# Patient Record
Sex: Female | Born: 1960 | Race: White | Hispanic: No | Marital: Married | Smoking: Former smoker
Health system: Southern US, Community
[De-identification: ages and names within clinical notes are randomized; demographics above are authoritative.]

## PROBLEM LIST (undated history)

## (undated) DIAGNOSIS — N939 Abnormal uterine and vaginal bleeding, unspecified: Secondary | ICD-10-CM

## (undated) DIAGNOSIS — D259 Leiomyoma of uterus, unspecified: Secondary | ICD-10-CM

## (undated) DIAGNOSIS — Z8639 Personal history of other endocrine, nutritional and metabolic disease: Secondary | ICD-10-CM

## (undated) DIAGNOSIS — D649 Anemia, unspecified: Secondary | ICD-10-CM

## (undated) DIAGNOSIS — Z780 Asymptomatic menopausal state: Secondary | ICD-10-CM

## (undated) HISTORY — DX: Asymptomatic menopausal state: Z78.0

## (undated) HISTORY — DX: Abnormal uterine and vaginal bleeding, unspecified: N93.9

## (undated) HISTORY — DX: Anemia, unspecified: D64.9

## (undated) HISTORY — PX: TONSILLECTOMY: SHX5217

## (undated) HISTORY — DX: Leiomyoma of uterus, unspecified: D25.9

## (undated) HISTORY — DX: Personal history of other endocrine, nutritional and metabolic disease: Z86.39

---

## 2006-11-01 ENCOUNTER — Ambulatory Visit: Payer: Self-pay | Admitting: Unknown Physician Specialty

## 2008-12-09 ENCOUNTER — Ambulatory Visit: Payer: Self-pay | Admitting: Unknown Physician Specialty

## 2009-12-06 ENCOUNTER — Ambulatory Visit: Payer: Self-pay | Admitting: Internal Medicine

## 2009-12-09 ENCOUNTER — Ambulatory Visit: Payer: Self-pay | Admitting: Internal Medicine

## 2009-12-14 ENCOUNTER — Ambulatory Visit: Payer: Self-pay | Admitting: Unknown Physician Specialty

## 2010-01-06 ENCOUNTER — Ambulatory Visit: Payer: Self-pay | Admitting: Internal Medicine

## 2010-02-01 ENCOUNTER — Ambulatory Visit: Payer: Self-pay | Admitting: Gastroenterology

## 2010-02-05 ENCOUNTER — Ambulatory Visit: Payer: Self-pay | Admitting: Internal Medicine

## 2010-02-07 LAB — PATHOLOGY REPORT

## 2012-12-06 ENCOUNTER — Ambulatory Visit: Payer: Self-pay | Admitting: Adult Health

## 2012-12-17 ENCOUNTER — Ambulatory Visit (INDEPENDENT_AMBULATORY_CARE_PROVIDER_SITE_OTHER): Payer: No Typology Code available for payment source | Admitting: Adult Health

## 2012-12-17 ENCOUNTER — Encounter: Payer: Self-pay | Admitting: Adult Health

## 2012-12-17 VITALS — BP 108/60 | HR 75 | Temp 98.2°F | Resp 12 | Ht 67.0 in | Wt 164.0 lb

## 2012-12-17 DIAGNOSIS — G479 Sleep disorder, unspecified: Secondary | ICD-10-CM | POA: Insufficient documentation

## 2012-12-17 DIAGNOSIS — K5900 Constipation, unspecified: Secondary | ICD-10-CM

## 2012-12-17 DIAGNOSIS — Z0000 Encounter for general adult medical examination without abnormal findings: Secondary | ICD-10-CM | POA: Insufficient documentation

## 2012-12-17 DIAGNOSIS — N951 Menopausal and female climacteric states: Secondary | ICD-10-CM | POA: Insufficient documentation

## 2012-12-17 NOTE — Assessment & Plan Note (Addendum)
Normal physical exam. Request medical records from previous PCP. Check labs: cbc w/diff, bmet, hepatic panel, tsh, vit d, FSH, LH, lipids. Mammogram scheduled for end of month. Colonoscopy next year. PAP 2011 (normal).

## 2012-12-17 NOTE — Progress Notes (Signed)
Subjective:    Patient ID: Caitlyn [name], female    DOB: 26-Feb-1961, 52 y.o.   MRN: [id number]  HPI  Patient is a pleasant 52 y/o female who presents to clinic to establish care. She has been experiencing symptoms associated with perimenopause such as irregular menstrual cycles, night sweats and some difficulty sleeping. Overall patient feels well and feels in good health.    Past Medical History  Diagnosis Date  . H/O vitamin D deficiency      Past Surgical History  Procedure Laterality Date  . Tonsillectomy  1960s     Family History  Problem Relation Age of Onset  . Hypertension Mother   . Diabetes Mother   . Heart disease Mother   . Hypertension Father   . Hemochromatosis Brother      History   Social History  . Marital Status: Married    Spouse Name: N/A    Number of Children: 2  . Years of Education: 14   Occupational History  . Deputy Curator of [employer]   Social History Main Topics  . Smoking status: Former Smoker -- 10 years    Quit date: 05/08/1992  . Smokeless tobacco: Never Used  . Alcohol Use: Yes     Comment: occasionally  . Drug Use: No  . Sexually Active: Not on file   Other Topics Concern  . Not on file   Social History Narrative   Elleana was born and reared in [place]. She graduated from [employer] (now [employer]) in 1982 with a Business degree. 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 She lives at home with her daughter. Salma has been separated for approximately 10 years. She enjoys spending time with friends. She enjoys sports - baseball, football.     Review of Systems  Constitutional: Negative.   HENT: Negative.   Eyes:       Wears contacts and reading glasses. Last eye exam 11/2011.  Respiratory: Negative.   Cardiovascular: Negative.   Gastrointestinal: Positive for constipation. Negative for nausea, vomiting, abdominal pain, blood in stool and abdominal distention.   Endocrine: Negative.   Genitourinary: Positive for urgency and menstrual problem. Negative for dysuria, frequency, hematuria and flank pain.       Perimenopausal  Musculoskeletal: Negative.   Skin:       Itching  Allergic/Immunologic: Negative for food allergies.       Seasonal allergies  Neurological: Negative for tremors, seizures, weakness, light-headedness and numbness.       Occasional dizziness  Hematological: Negative.   Psychiatric/Behavioral: Positive for sleep disturbance. Negative for suicidal ideas, hallucinations, behavioral problems, confusion, self-injury, decreased concentration and agitation. The patient is nervous/anxious.     BP 108/60  Pulse 75  Temp(Src) 98.2 F (36.8 C) (Oral)  Resp 12  Ht 5\' 7"  (1.702 m)  Wt 164 lb (74.39 kg)  BMI 25.68 kg/m2  SpO2 99%  LMP 12/12/2012    Objective:   Physical Exam  Constitutional: She is oriented to person, place, and time. She appears well-developed and well-nourished. No distress.  HENT:  Head: Normocephalic and atraumatic.  Right Ear: External ear normal.  Left Ear: External ear normal.  Mouth/Throat: Oropharynx is clear and moist.  Eyes: Conjunctivae and EOM are normal. Pupils are equal, round, and reactive to light.  Neck: Normal range of motion. Neck supple. No tracheal deviation present. No thyromegaly present.  Cardiovascular: Normal rate, regular rhythm, normal heart  sounds and intact distal pulses.  Exam reveals no gallop and no friction rub.   No murmur heard. Pulmonary/Chest: Effort normal and breath sounds normal. No respiratory distress. She has no wheezes. She has no rales.  Abdominal: Soft. Bowel sounds are normal. She exhibits no distension and no mass. There is no tenderness. There is no rebound and no guarding.  Musculoskeletal: Normal range of motion. She exhibits no edema and no tenderness.  Lymphadenopathy:    She has no cervical adenopathy.  Neurological: She is alert and oriented to person,  place, and time. She has normal reflexes. No cranial nerve deficit. Coordination normal.  Skin: Skin is warm and dry.  Psychiatric: She has a normal mood and affect. Her behavior is normal. Judgment and thought content normal.      Assessment & Plan:

## 2012-12-17 NOTE — Assessment & Plan Note (Signed)
Symptoms of perimenopause. She does not have any hot flashes but experiences night sweats, sleep disturbance, menstrual irregularity. Check FSH, LH. Also checking routine labs.

## 2012-12-17 NOTE — Assessment & Plan Note (Signed)
Hx of constipation. As long as she takes her iron supplements she reports she stays regular. Typically, the opposite occurs. Suggested she continue her iron supplements.

## 2012-12-17 NOTE — Patient Instructions (Addendum)
   Thank you for choosing [facility] for your health care needs.  Please have your labs drawn at your earliest convenience and have them sent to my office.  The results will be available through MyChart for your convenience. Please remember to activate this. The activation code is located at the end of this form.

## 2012-12-17 NOTE — Assessment & Plan Note (Signed)
Falls asleep without any problems. Has trouble staying asleep. Perimenopausal. Recommend trying melatonin OTC.

## 2012-12-31 ENCOUNTER — Ambulatory Visit: Payer: Self-pay | Admitting: General Practice

## 2013-03-13 ENCOUNTER — Other Ambulatory Visit: Payer: Self-pay

## 2013-03-25 ENCOUNTER — Encounter: Payer: No Typology Code available for payment source | Admitting: Adult Health

## 2013-04-07 ENCOUNTER — Encounter: Payer: No Typology Code available for payment source | Admitting: Adult Health

## 2013-04-08 ENCOUNTER — Ambulatory Visit (INDEPENDENT_AMBULATORY_CARE_PROVIDER_SITE_OTHER): Payer: No Typology Code available for payment source | Admitting: Adult Health

## 2013-04-08 ENCOUNTER — Encounter: Payer: Self-pay | Admitting: Adult Health

## 2013-04-08 ENCOUNTER — Other Ambulatory Visit (HOSPITAL_COMMUNITY)
Admission: RE | Admit: 2013-04-08 | Discharge: 2013-04-08 | Disposition: A | Discharge status: Home / Self Care | Payer: No Typology Code available for payment source | Source: Ambulatory Visit | Attending: Adult Health | Admitting: Adult Health

## 2013-04-08 VITALS — BP 122/82 | HR 84 | Temp 97.8°F | Resp 14 | Ht 67.0 in | Wt 166.0 lb

## 2013-04-08 DIAGNOSIS — Z124 Encounter for screening for malignant neoplasm of cervix: Secondary | ICD-10-CM | POA: Insufficient documentation

## 2013-04-08 DIAGNOSIS — Z1151 Encounter for screening for human papillomavirus (HPV): Secondary | ICD-10-CM | POA: Insufficient documentation

## 2013-04-08 DIAGNOSIS — Z01419 Encounter for gynecological examination (general) (routine) without abnormal findings: Secondary | ICD-10-CM | POA: Insufficient documentation

## 2013-04-08 NOTE — Assessment & Plan Note (Signed)
PAP w/HPV screen.

## 2013-04-08 NOTE — Assessment & Plan Note (Signed)
GYN exam including breast, pelvic/PAP. Normal exam. Mammogram done at [facility] in August - normal. Requesting report. Requesting labs from [facility].

## 2013-04-08 NOTE — Progress Notes (Signed)
   Subjective:    Patient ID: Caitlyn [name], female    DOB: 1961/05/03, 52 y.o.   MRN: [id number]  HPI  Patient is a pleasant 52 y/o female who presents to clinic for her GYN exam including breast and PAP.  Mammogram done in August. Reports normal exam. Will request records.   Current Outpatient Prescriptions on File Prior to Visit  Medication Sig Dispense Refill  . Cholecalciferol (VITAMIN D) 2000 UNITS tablet Take 2,000 Units by mouth daily.      . ferrous sulfate 325 (65 FE) MG tablet Take 325 mg by mouth 2 (two) times daily.       No current facility-administered medications on file prior to visit.     Review of Systems  Constitutional: Negative.   Respiratory: Negative.   Cardiovascular: Negative.   Gastrointestinal: Negative.   Genitourinary: Negative.   Neurological: Negative.        Objective:   Physical Exam  Constitutional: She appears well-developed and well-nourished. No distress.  Pulmonary/Chest: Right breast exhibits no inverted nipple, no mass, no nipple discharge, no skin change and no tenderness. Left breast exhibits no inverted nipple, no mass, no nipple discharge, no skin change and no tenderness. Breasts are symmetrical.  Abdominal: Hernia confirmed negative in the right inguinal area and confirmed negative in the left inguinal area.  Genitourinary: Vagina normal. No breast swelling, tenderness, discharge or bleeding. No labial fusion. There is no rash, tenderness, lesion or injury on the right labia. There is no rash, tenderness, lesion or injury on the left labia. No erythema, tenderness or bleeding around the vagina. No foreign body around the vagina. No signs of injury around the vagina. No vaginal discharge found.  Lymphadenopathy:       Right: No inguinal adenopathy present.       Left: No inguinal adenopathy present.          Assessment & Plan:

## 2013-04-08 NOTE — Progress Notes (Signed)
Pre visit review using our clinic review tool, if applicable. No additional management support is needed unless otherwise documented below in the visit note. 

## 2013-04-11 ENCOUNTER — Encounter: Payer: Self-pay | Admitting: Adult Health

## 2013-04-14 NOTE — Telephone Encounter (Signed)
Mailed unread message to pt  

## 2013-04-24 ENCOUNTER — Encounter: Payer: Self-pay | Admitting: Internal Medicine

## 2013-07-23 ENCOUNTER — Telehealth: Payer: Self-pay | Admitting: Emergency Medicine

## 2013-07-23 NOTE — Telephone Encounter (Signed)
PT calling in stating she has lvm x3. Pt is having irregular periods. Pt started on 2/20 and has not stopped bleeding. Please advise whether this is normal or she needs to be seen. Please advise.

## 2013-07-23 NOTE — Telephone Encounter (Signed)
Patient stated the bleeding light, bright red but has not stopped since 2/20. Patient is technically due for her full cycle on 07/25/13. Patient wants to wait to see if she fully stops by Monday. She has a busy work schedule tomorrow and Friday, actually out of town- so going for a u/s right now would not work. She will call us Monday 07/28/13 in the morning if it hasn't stopped.

## 2013-07-24 NOTE — Telephone Encounter (Signed)
Got it.

## 2013-07-24 NOTE — Telephone Encounter (Signed)
I will not be here Monday morning. If she calls still having problems then send her for the ultrasound. I will sign when I get in.

## 2014-05-27 ENCOUNTER — Encounter: Payer: No Typology Code available for payment source | Admitting: Nurse Practitioner

## 2014-06-11 ENCOUNTER — Encounter: Payer: Self-pay | Admitting: Nurse Practitioner

## 2014-06-11 ENCOUNTER — Ambulatory Visit (INDEPENDENT_AMBULATORY_CARE_PROVIDER_SITE_OTHER): Payer: No Typology Code available for payment source | Admitting: Nurse Practitioner

## 2014-06-11 VITALS — BP 122/80 | HR 81 | Temp 98.2°F | Resp 12 | Ht 67.0 in | Wt 157.0 lb

## 2014-06-11 DIAGNOSIS — Z0000 Encounter for general adult medical examination without abnormal findings: Secondary | ICD-10-CM

## 2014-06-11 DIAGNOSIS — N939 Abnormal uterine and vaginal bleeding, unspecified: Secondary | ICD-10-CM

## 2014-06-11 DIAGNOSIS — R1031 Right lower quadrant pain: Secondary | ICD-10-CM

## 2014-06-11 DIAGNOSIS — G8929 Other chronic pain: Secondary | ICD-10-CM

## 2014-06-11 NOTE — Progress Notes (Signed)
   Subjective:    Patient ID: Caitlyn [name], female    DOB: 1960-07-22, 54 y.o.   MRN: [id number]  HPI    Review of Systems     Objective:   Physical Exam        Assessment & Plan:

## 2014-06-11 NOTE — Patient Instructions (Addendum)
Return in 1 year.   Health Maintenance Adopting a healthy lifestyle and getting preventive care can go a long way to promote health and wellness. Talk with your health care provider about what schedule of regular examinations is right for you. This is a good chance for you to check in with your provider about disease prevention and staying healthy. In between checkups, there are plenty of things you can do on your own. Experts have done a lot of research about which lifestyle changes and preventive measures are most likely to keep you healthy. Ask your health care provider for more information. WEIGHT AND DIET  Eat a healthy diet  Be sure to include plenty of vegetables, fruits, low-fat dairy products, and lean protein.  Do not eat a lot of foods high in solid fats, added sugars, or salt.  Get regular exercise. This is one of the most important things you can do for your health.  Most adults should exercise for at least 150 minutes each week. The exercise should increase your heart rate and make you sweat (moderate-intensity exercise).  Most adults should also do strengthening exercises at least twice a week. This is in addition to the moderate-intensity exercise.  Maintain a healthy weight  Body mass index (BMI) is a measurement that can be used to identify possible weight problems. It estimates body fat based on height and weight. Your health care provider can help determine your BMI and help you achieve or maintain a healthy weight.  For females 25 years of age and older:   A BMI below 18.5 is considered underweight.  A BMI of 18.5 to 24.9 is normal.  A BMI of 25 to 29.9 is considered overweight.  A BMI of 30 and above is considered obese.  Watch levels of cholesterol and blood lipids  You should start having your blood tested for lipids and cholesterol at 54 years of age, then have this test every 5 years.  You may need to have your cholesterol levels checked more often  if:  Your lipid or cholesterol levels are high.  You are older than 54 years of age.  You are at high risk for heart disease.  CANCER SCREENING   Lung Cancer  Lung cancer screening is recommended for adults 84-69 years old who are at high risk for lung cancer because of a history of smoking.  A yearly low-dose CT scan of the lungs is recommended for people who:  Currently smoke.  Have quit within the past 15 years.  Have at least a 30-pack-year history of smoking. A pack year is smoking an average of one pack of cigarettes a day for 1 year.  Yearly screening should continue until it has been 15 years since you quit.  Yearly screening should stop if you develop a health problem that would prevent you from having lung cancer treatment.  Breast Cancer  Practice breast self-awareness. This means understanding how your breasts normally appear and feel.  It also means doing regular breast self-exams. Let your health care provider know about any changes, no matter how small.  If you are in your 20s or 30s, you should have a clinical breast exam (CBE) by a health care provider every 1-3 years as part of a regular health exam.  If you are 69 or older, have a CBE every year. Also consider having a breast X-ray (mammogram) every year.  If you have a family history of breast cancer, talk to your health care provider about  genetic screening.  If you are at high risk for breast cancer, talk to your health care provider about having an MRI and a mammogram every year.  Breast cancer gene (BRCA) assessment is recommended for women who have family members with BRCA-related cancers. BRCA-related cancers include:  Breast.  Ovarian.  Tubal.  Peritoneal cancers.  Results of the assessment will determine the need for genetic counseling and BRCA1 and BRCA2 testing. Cervical Cancer Routine pelvic examinations to screen for cervical cancer are no longer recommended for nonpregnant women  who are considered low risk for cancer of the pelvic organs (ovaries, uterus, and vagina) and who do not have symptoms. A pelvic examination may be necessary if you have symptoms including those associated with pelvic infections. Ask your health care provider if a screening pelvic exam is right for you.   The Pap test is the screening test for cervical cancer for women who are considered at risk.  If you had a hysterectomy for a problem that was not cancer or a condition that could lead to cancer, then you no longer need Pap tests.  If you are older than 65 years, and you have had normal Pap tests for the past 10 years, you no longer need to have Pap tests.  If you have had past treatment for cervical cancer or a condition that could lead to cancer, you need Pap tests and screening for cancer for at least 20 years after your treatment.  If you no longer get a Pap test, assess your risk factors if they change (such as having a new sexual partner). This can affect whether you should start being screened again.  Some women have medical problems that increase their chance of getting cervical cancer. If this is the case for you, your health care provider may recommend more frequent screening and Pap tests.  The human papillomavirus (HPV) test is another test that may be used for cervical cancer screening. The HPV test looks for the virus that can cause cell changes in the cervix. The cells collected during the Pap test can be tested for HPV.  The HPV test can be used to screen women 28 years of age and older. Getting tested for HPV can extend the interval between normal Pap tests from three to five years.  An HPV test also should be used to screen women of any age who have unclear Pap test results.  After 54 years of age, women should have HPV testing as often as Pap tests.  Colorectal Cancer  This type of cancer can be detected and often prevented.  Routine colorectal cancer screening usually  begins at 54 years of age and continues through 54 years of age.  Your health care provider may recommend screening at an earlier age if you have risk factors for colon cancer.  Your health care provider may also recommend using home test kits to check for hidden blood in the stool.  A small camera at the end of a tube can be used to examine your colon directly (sigmoidoscopy or colonoscopy). This is done to check for the earliest forms of colorectal cancer.  Routine screening usually begins at age 70.  Direct examination of the colon should be repeated every 5-10 years through 54 years of age. However, you may need to be screened more often if early forms of precancerous polyps or small growths are found. Skin Cancer  Check your skin from head to toe regularly.  Tell your health care provider about  any new moles or changes in moles, especially if there is a change in a mole's shape or color.  Also tell your health care provider if you have a mole that is larger than the size of a pencil eraser.  Always use sunscreen. Apply sunscreen liberally and repeatedly throughout the day.  Protect yourself by wearing long sleeves, pants, a wide-brimmed hat, and sunglasses whenever you are outside. HEART DISEASE, DIABETES, AND HIGH BLOOD PRESSURE   Have your blood pressure checked at least every 1-2 years. High blood pressure causes heart disease and increases the risk of stroke.  If you are between 10 years and 71 years old, ask your health care provider if you should take aspirin to prevent strokes.  Have regular diabetes screenings. This involves taking a blood sample to check your fasting blood sugar level.  If you are at a normal weight and have a low risk for diabetes, have this test once every three years after 54 years of age.  If you are overweight and have a high risk for diabetes, consider being tested at a younger age or more often. PREVENTING INFECTION  Hepatitis B  If you have a  higher risk for hepatitis B, you should be screened for this virus. You are considered at high risk for hepatitis B if:  You were born in a country where hepatitis B is common. Ask your health care provider which countries are considered high risk.  Your parents were born in a high-risk country, and you have not been immunized against hepatitis B (hepatitis B vaccine).  You have HIV or AIDS.  You use needles to inject street drugs.  You live with someone who has hepatitis B.  You have had sex with someone who has hepatitis B.  You get hemodialysis treatment.  You take certain medicines for conditions, including cancer, organ transplantation, and autoimmune conditions. Hepatitis C  Blood testing is recommended for:  Everyone born from [birth year] through 1965.  Anyone with known risk factors for hepatitis C. Sexually transmitted infections (STIs)  You should be screened for sexually transmitted infections (STIs) including gonorrhea and chlamydia if:  You are sexually active and are younger than 54 years of age.  You are older than 54 years of age and your health care provider tells you that you are at risk for this type of infection.  Your sexual activity has changed since you were last screened and you are at an increased risk for chlamydia or gonorrhea. Ask your health care provider if you are at risk.  If you do not have HIV, but are at risk, it may be recommended that you take a prescription medicine daily to prevent HIV infection. This is called pre-exposure prophylaxis (PrEP). You are considered at risk if:  You are sexually active and do not regularly use condoms or know the HIV status of your partner(s).  You take drugs by injection.  You are sexually active with a partner who has HIV. Talk with your health care provider about whether you are at high risk of being infected with HIV. If you choose to begin PrEP, you should first be tested for HIV. You should then be tested  every 3 months for as long as you are taking PrEP.  PREGNANCY   If you are premenopausal and you may become pregnant, ask your health care provider about preconception counseling.  If you may become pregnant, take 400 to 800 micrograms (mcg) of folic acid every day.  If you want  to prevent pregnancy, talk to your health care provider about birth control (contraception). OSTEOPOROSIS AND MENOPAUSE   Osteoporosis is a disease in which the bones lose minerals and strength with aging. This can result in serious bone fractures. Your risk for osteoporosis can be identified using a bone density scan.  If you are 65 years of age or older, or if you are at risk for osteoporosis and fractures, ask your health care provider if you should be screened.  Ask your health care provider whether you should take a calcium or vitamin D supplement to lower your risk for osteoporosis.  Menopause may have certain physical symptoms and risks.  Hormone replacement therapy may reduce some of these symptoms and risks. Talk to your health care provider about whether hormone replacement therapy is right for you.  HOME CARE INSTRUCTIONS   Schedule regular health, dental, and eye exams.  Stay current with your immunizations.   Do not use any tobacco products including cigarettes, chewing tobacco, or electronic cigarettes.  If you are pregnant, do not drink alcohol.  If you are breastfeeding, limit how much and how often you drink alcohol.  Limit alcohol intake to no more than 1 drink per day for nonpregnant women. One drink equals 12 ounces of beer, 5 ounces of wine, or 1 ounces of hard liquor.  Do not use street drugs.  Do not share needles.  Ask your health care provider for help if you need support or information about quitting drugs.  Tell your health care provider if you often feel depressed.  Tell your health care provider if you have ever been abused or do not feel safe at home. Document  Released: 11/07/2010 Document Revised: 09/08/2013 Document Reviewed: 03/26/2013 [facility] Patient Information 2015 [place], [place]. This information is not intended to replace advice given to you by your health care provider. Make sure you discuss any questions you have with your health care provider.

## 2014-06-11 NOTE — Progress Notes (Signed)
Subjective:    Patient ID: Caitlyn [name], female    DOB: Jun 02, 1960, 54 y.o.   MRN: [id number]  HPI  Caitlyn [name] is a 54 yo female here for her annual exam.   1) Health Maintenance-   Diet- No formal diet  Exercise- No formal diet  Immunizations- Up to date  Mammogram- 2014 normal   Pap- 2014 okay for 5 years  Bone Density- N/A  Colonoscopy- Within the past few years (unsure of date)  [name] Exam- Dec. 2015  Dental Exam- Up to date  2) Chronic Problems-  Reviewed with pt  3) Acute Problems-  Periods- 16 days this time. First 2 days are heavy the rest days are light the rest. Easier periods when younger. No [place] studies in past   Review of Systems  Constitutional: Negative for fever, chills, diaphoresis, fatigue and unexpected weight change.  HENT: Negative for tinnitus and trouble swallowing.   Eyes: Negative for visual disturbance.  Respiratory: Negative for chest tightness, shortness of breath and wheezing.   Cardiovascular: Negative for chest pain, palpitations and leg swelling.  Gastrointestinal: Positive for abdominal pain and constipation. Negative for nausea, vomiting, diarrhea and abdominal distention.       Lower abdominal pain on right- pt feels it is ovary related  Genitourinary: Positive for vaginal bleeding and menstrual problem. Negative for dysuria.  Musculoskeletal: Negative for back pain and neck pain.  Skin: Negative for rash.  Neurological: Negative for dizziness, weakness and headaches.  Psychiatric/Behavioral: Positive for sleep disturbance. Negative for suicidal ideas. The patient is not nervous/anxious.        Denies depression   Past Medical History  Diagnosis Date  . H/O vitamin D deficiency   . Anemia     History   Social History  . Marital Status: Married    Spouse Name: N/A    Number of Children: 2  . Years of Education: 14   Occupational History  . Deputy Scientist, research (physical sciences) of [employer] History Main Topics    . Smoking status: Former Smoker -- 10 years    Quit date: 05/08/1992  . Smokeless tobacco: Never Used  . Alcohol Use: Yes     Comment: occasionally  . Drug Use: No  . Sexual Activity: Not on file   Other Topics Concern  . Not on file   Social History Narrative   [name] was born and reared in [place]. She graduated from [employer] (now [employer]) in 1982 with a Business degree. 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 She lives at home with her daughter. [name] has been separated for approximately 10 years. She enjoys spending time with friends. She enjoys sports - baseball, football.    Past Surgical History  Procedure Laterality Date  . Tonsillectomy  1960s    Family History  Problem Relation Age of Onset  . Hypertension Mother   . Diabetes Mother   . Heart disease Mother   . Hypertension Father   . Hemochromatosis Brother     No Known Allergies  Current Outpatient Prescriptions on File Prior to Visit  Medication Sig Dispense Refill  . Cholecalciferol (VITAMIN D) 2000 UNITS tablet Take 2,000 Units by mouth daily.    . ferrous sulfate 325 (65 FE) MG tablet Take 325 mg by mouth 2 (two) times daily.     No current facility-administered medications on file prior to visit.  Objective:   Physical Exam  Constitutional: She is oriented to person, place, and time. She appears well-developed and well-nourished. No distress.  BP 122/80 mmHg  Pulse 81  Temp(Src) 98.2 F (36.8 C) (Oral)  Resp 12  Ht 5\' 7"  (1.702 m)  Wt 157 lb (71.215 kg)  BMI 24.58 kg/m2  SpO2 98%   HENT:  Head: Normocephalic and atraumatic.  Right Ear: External ear normal.  Left Ear: External ear normal.  Eyes: Right [name] exhibits no discharge. Left [name] exhibits no discharge. No scleral icterus.  Neck: Normal range of motion. Neck supple. No thyromegaly present.  Cardiovascular: Normal rate, regular rhythm, normal heart sounds and intact distal pulses.  Exam reveals  no gallop and no friction rub.   No murmur heard. Pulmonary/Chest: Effort normal and breath sounds normal. No respiratory distress. She has no wheezes. She has no rales. She exhibits no mass and no tenderness. Right breast exhibits no inverted nipple, no mass, no nipple discharge, no skin change and no tenderness. Left breast exhibits no inverted nipple, no mass, no nipple discharge, no skin change and no tenderness. Breasts are symmetrical.  Genitourinary:  Deferred PAP until 2017.  Lymphadenopathy:    She has no cervical adenopathy.  Neurological: She is alert and oriented to person, place, and time. No cranial nerve deficit. She exhibits normal muscle tone. Coordination normal.  Skin: Skin is warm and dry. No rash noted. She is not diaphoretic.  Psychiatric: She has a normal mood and affect. Her behavior is normal. Judgment and thought content normal.      Assessment & Plan:

## 2014-06-11 NOTE — Progress Notes (Signed)
Pre visit review using our clinic review tool, if applicable. No additional management support is needed unless otherwise documented below in the visit note. 

## 2014-06-12 ENCOUNTER — Ambulatory Visit: Payer: Self-pay | Admitting: Nurse Practitioner

## 2014-06-14 DIAGNOSIS — G8929 Other chronic pain: Secondary | ICD-10-CM | POA: Insufficient documentation

## 2014-06-14 DIAGNOSIS — N950 Postmenopausal bleeding: Secondary | ICD-10-CM | POA: Insufficient documentation

## 2014-06-14 DIAGNOSIS — R1031 Right lower quadrant pain: Secondary | ICD-10-CM

## 2014-06-14 NOTE — Assessment & Plan Note (Signed)
Discussed acute and chronic issues. Reviewed health maintenance measures, PFSHx, and immunizations. Obtain routine labs TSH, Lipid panel, CBC w/ diff, A1c, and CMET (written out on script for pt to take to place [facility] [employer] uses for free lab work). Also, included [place].

## 2014-06-14 NOTE — Assessment & Plan Note (Signed)
Obtain Pelvic and transvaginal non-OB ultrasounds. FU after results.

## 2014-06-17 LAB — HEPATIC FUNCTION PANEL
ALK PHOS: 53 U/L (ref 25–125)
ALT: 16 U/L (ref 7–35)
AST: 17 U/L (ref 13–35)
Bilirubin, Total: 0.2 mg/dL

## 2014-06-17 LAB — BASIC METABOLIC PANEL
Creatinine: 0.8 mg/dL (ref 0.5–1.1)
GLUCOSE: 91 mg/dL
Potassium: 4.5 mmol/L (ref 3.4–5.3)
SODIUM: 142 mmol/L (ref 137–147)

## 2014-06-17 LAB — TSH: TSH: 2.24 u[IU]/mL (ref 0.41–5.90)

## 2014-06-17 LAB — LIPID PANEL
CHOLESTEROL: 216 mg/dL — AB (ref 0–200)
HDL: 67 mg/dL (ref 35–70)
LDL/HDL RATIO: 3.2
Triglycerides: 100 mg/dL (ref 40–160)

## 2014-06-17 LAB — CBC AND DIFFERENTIAL
HEMATOCRIT: 41 % (ref 36–46)
HEMOGLOBIN: 13 g/dL (ref 12.0–16.0)
Platelets: 219 10*3/uL (ref 150–399)
WBC: 6.7 10*3/mL

## 2014-07-07 ENCOUNTER — Other Ambulatory Visit: Payer: Self-pay | Admitting: Nurse Practitioner

## 2014-07-07 DIAGNOSIS — D259 Leiomyoma of uterus, unspecified: Secondary | ICD-10-CM

## 2014-08-13 HISTORY — PX: ENDOMETRIAL BIOPSY: SHX622

## 2015-04-21 ENCOUNTER — Ambulatory Visit: Payer: Self-pay | Admitting: Obstetrics and Gynecology

## 2015-06-29 ENCOUNTER — Encounter: Payer: Self-pay | Admitting: Obstetrics and Gynecology

## 2015-06-29 ENCOUNTER — Ambulatory Visit (INDEPENDENT_AMBULATORY_CARE_PROVIDER_SITE_OTHER): Payer: Managed Care, Other (non HMO) | Admitting: Obstetrics and Gynecology

## 2015-06-29 VITALS — BP 147/69 | HR 73 | Ht 67.0 in | Wt 162.0 lb

## 2015-06-29 DIAGNOSIS — D259 Leiomyoma of uterus, unspecified: Secondary | ICD-10-CM | POA: Diagnosis not present

## 2015-06-29 DIAGNOSIS — N939 Abnormal uterine and vaginal bleeding, unspecified: Secondary | ICD-10-CM | POA: Diagnosis not present

## 2015-06-29 NOTE — Progress Notes (Signed)
Chief complaint: 1. Uterine fibroids 2. Abnormal uterine bleeding  The patient is a 55 year old white female para 2002, perimenopausal, with known fibroid uterus and history of irregular bleeding, who presents for evaluation of this problem. No anemia symptoms. No pelvic pain. No atypical vaginal discharge.  Endometrial biopsy was performed in April 2016 for abnormal uterine bleeding; pathology demonstrated proliferative endometrium without hyperplasia or carcinoma. Ultrasound at that time demonstrated a multi-fibroid uterus. Thyroid studies were normal. CBC did not show evidence of anemia. Pelvic exam was consistent with a 10-12 week size fibroid uterus.  Menstrual calendar review: 04/26/2014-14 day cycle  05/26/2014-36 day cycle 08/12/2014 -14 day cycle 10/10/2014- 5 day cycle 11/02/2014 -9 day cycle 11/26/2014 -6 day cycle 8/4/ 2016 9- day cycle 01/02/2015 -8 day cycle 01/28/2015 -21 day cycle No further menses until present  Past Medical History  Diagnosis Date  . H/O vitamin D deficiency   . Anemia   . Abnormal uterine bleeding (AUB)   . Uterine leiomyoma   . Menopause    Past Surgical History  Procedure Laterality Date  . Tonsillectomy  1960s  . Endometrial biopsy  08/13/2014    weakly proliferative endometrium- no hyperplasia or carcinoma   Review of systems: Per history of present illness  OBJECTIVE: BP 147/69 mmHg  Pulse 73  Ht 5\' 7"  (1.702 m)  Wt 162 lb (73.483 kg)  BMI 25.37 kg/m2  LMP 01/28/2015 Pleasant well-appearing white female in no acute distress. Abdomen: Soft, nontender, without organomegaly Pelvic exam:  Bimanual exam-retroverted globular/bulky 14 week size uterus, decreased mobility, extending to adnexa bilaterally  ASSESSMENT: 1. Multi-fibroid uterus, 14 week size 2. Abnormal uterine bleeding without recent evaluation with endometrial biopsy  PLAN: 1. Ultrasound to assess for interval change in size of fibroids and endometrial stripe  assessment 2. Return in 1 week after ultrasound for possible endometrial biopsy and further management planning  A total of 15 minutes were spent face-to-face with the patient during this encounter and over half of that time dealt with counseling and coordination of care.  Brayton Mars, MD  Note: This dictation was prepared with Dragon dictation along with smaller phrase technology. Any transcriptional errors that result from this process are unintentional.

## 2015-06-29 NOTE — Patient Instructions (Signed)
1. U/S is scheduled. 2. Return 1 week after U/S for further management planning

## 2015-07-06 ENCOUNTER — Ambulatory Visit (INDEPENDENT_AMBULATORY_CARE_PROVIDER_SITE_OTHER): Payer: Managed Care, Other (non HMO)

## 2015-07-06 DIAGNOSIS — N939 Abnormal uterine and vaginal bleeding, unspecified: Secondary | ICD-10-CM

## 2015-07-06 DIAGNOSIS — R1031 Right lower quadrant pain: Secondary | ICD-10-CM | POA: Diagnosis not present

## 2015-07-06 DIAGNOSIS — G8929 Other chronic pain: Secondary | ICD-10-CM

## 2015-07-13 ENCOUNTER — Encounter: Payer: Self-pay | Admitting: Obstetrics and Gynecology

## 2015-07-13 ENCOUNTER — Ambulatory Visit (INDEPENDENT_AMBULATORY_CARE_PROVIDER_SITE_OTHER): Payer: Managed Care, Other (non HMO) | Admitting: Obstetrics and Gynecology

## 2015-07-13 ENCOUNTER — Ambulatory Visit: Payer: Managed Care, Other (non HMO) | Admitting: Obstetrics and Gynecology

## 2015-07-13 VITALS — BP 148/76 | HR 66 | Ht 67.0 in | Wt 160.7 lb

## 2015-07-13 DIAGNOSIS — R9389 Abnormal findings on diagnostic imaging of other specified body structures: Secondary | ICD-10-CM

## 2015-07-13 DIAGNOSIS — N841 Polyp of cervix uteri: Secondary | ICD-10-CM | POA: Diagnosis not present

## 2015-07-13 DIAGNOSIS — R938 Abnormal findings on diagnostic imaging of other specified body structures: Secondary | ICD-10-CM

## 2015-07-13 DIAGNOSIS — D259 Leiomyoma of uterus, unspecified: Secondary | ICD-10-CM | POA: Diagnosis not present

## 2015-07-13 DIAGNOSIS — N939 Abnormal uterine and vaginal bleeding, unspecified: Secondary | ICD-10-CM

## 2015-07-13 NOTE — Patient Instructions (Addendum)
1. Endometrial biopsy and cervical biopsy were taken today 2. Results will be given  by phone when available 3. Return in 6 months for follow-up

## 2015-07-13 NOTE — Progress Notes (Signed)
Chief complaint: 1.  Follow-up on ultrasound. 2.  Abnormal uterine bleeding 3.  History of uterine fibroids  55 year old female, para 2002, perimenopausal, with known fibroid uterus and history of irregular bleeding.  Presents for follow-up on recent pelvic ultrasound and scheduled for endometrial biopsy.  Uterine size over the past year has gone from 12-14 weeks. Endometrial biopsy April 2016 was benign.  Current ultrasound demonstrates multi-fibroid uterus and endometrial stripe measuring 12.8 mm.  Biopsy is indicated.  Past medical history, past surgical history, problem list, medications, and allergies are reviewed.  OBJECTIVE: BP 148/76 mmHg  Pulse 66  Ht 5\' 7"  (1.702 m)  Wt 160 lb 11.2 oz (72.893 kg)  BMI 25.16 kg/m2  LMP 01/28/2015 Pelvic exam: Bimanual-midplane uterus, globular, with posterior component, approximately 14 weeks' size. Endocervical polyp - 7 mm is identified on speculum exam  PROCEDURE: Endometrial biopsy/Cervical biopsy Uterine sounding-9 cm 3 mm pipette biopsy-2 passes with bloody tissue, abundant Procedure well-tolerated. Blood loss-minimal  Description-patient was positioned in the dorsal lithotomy position.  A Graves speculum is placed.  Cervix appears parous and pipette, 3 mm was introduced through the endocervical canal in a sterile technique with production of abundant tissue; repeat pass was performed with obtaining additional tissue. Procedure: Cervical biopsy Endocervical polyp is identified and removed with Tischler forceps.  Monsel solution is applied for hemostasis  ASSESSMENT: 1.  AUB. 2.  Multi-fibroid uterus. 3.  Cervical polyp  PLAN: 1.  Endometrial biopsy as noted. 2.  Cervical biopsy as noted. 3.  Ultrasound findings were reviewed. 4.  Patient will be notified by phone of results. 5.  Patient is to maintain menstrual calendar monitor and follow-up in 6 months for reassessment.  A total of 15 minutes were spent face-to-face with  the patient during this encounter and over half of that time dealt with counseling and coordination of care.  Brayton Mars, MD  Note: This dictation was prepared with Dragon dictation along with smaller phrase technology. Any transcriptional errors that result from this process are unintentional.

## 2015-07-16 LAB — PATHOLOGY

## 2016-02-02 NOTE — Progress Notes (Signed)
ANNUAL PREVENTATIVE CARE GYN  ENCOUNTER NOTE  Subjective:       Caitlyn [name] is a 55 y.o. G63P2002 female here for a routine annual gynecologic exam.  Current complaints: 1.  Dad passed away  December 08, 2015- pancreatic ca 2. hot flashes and nite sweats- no meds needed  No postmenopausal bleeding Irritable bowel syndrome, stable No pelvic pain  Gynecologic History No LMP recorded. Contraception: none Last Pap: unsure- . Results were: normal Last mammogram: 2016??. Results were: normal  Obstetric History OB History  Gravida Para Term Preterm AB Living  2 2 2     2   SAB TAB Ectopic Multiple Live Births          2    # Outcome Date GA Lbr Len/2nd Weight Sex Delivery Anes PTL Lv  2 Term 1992   7 lb 1.8 oz (3.225 kg) F Vag-Spont   LIV  1 Term 1985   9 lb (4.082 kg) M Vag-Spont   LIV      Past Medical History:  Diagnosis Date  . Abnormal uterine bleeding (AUB)   . Anemia   . H/O vitamin D deficiency   . Menopause   . Uterine leiomyoma     Past Surgical History:  Procedure Laterality Date  . ENDOMETRIAL BIOPSY  08/13/2014   weakly proliferative endometrium- no hyperplasia or carcinoma  . TONSILLECTOMY  1960s    Current Outpatient Prescriptions on File Prior to Visit  Medication Sig Dispense Refill  . Cholecalciferol (VITAMIN D) 2000 UNITS tablet Take 2,000 Units by mouth daily.    . ferrous sulfate 325 (65 FE) MG tablet Take 325 mg by mouth 2 (two) times daily.     No current facility-administered medications on file prior to visit.     No Known Allergies  Social History   Social History  . Marital status: Married    Spouse name: N/A  . Number of children: 2  . Years of education: 14   Occupational History  . Deputy Engineer, [employer] of [place] History Main Topics  . Smoking status: Former Smoker    Years: 10.00    Quit date: 05/08/1992  . Smokeless tobacco: Never Used  . Alcohol use Yes     Comment: rare  .  Drug use:   . Sexual activity: Not Currently   Other Topics Concern  . Not on file   Social History Narrative   Caitlyn [name] was born and reared in [place]. She graduated from [employer]now [employer]) in 1982 with a Business degree. 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 She lives at home with her daughter. Annaliese has been separated for approximately 10 years. She enjoys spending time with friends. She enjoys sports - baseball, football.    Family History  Problem Relation Age of Onset  . Hypertension Mother   . Diabetes Mother   . Heart disease Mother   . Hypertension Father   . Diabetes Father   . Pancreatic cancer Father   . Hemochromatosis Brother   . Cancer Neg Hx     The following portions of the patient's history were reviewed and updated as appropriate: allergies, current medications, past family history, past medical history, past social history, past surgical history and problem list.  Review of Systems ROS Review of Systems - General ROS: negative for - chills, fatigue, fever, hot flashes, night sweats, weight gain or weight loss  Psychological ROS: negative for - anxiety, decreased libido, depression, mood swings, physical abuse or sexual abuse Ophthalmic ROS: negative for - blurry vision, eye pain or loss of vision ENT ROS: negative for - headaches, hearing change, visual changes or vocal changes Allergy and Immunology ROS: negative for - hives, itchy/watery eyes or seasonal allergies Hematological and Lymphatic ROS: negative for - bleeding problems, bruising, swollen lymph nodes or weight loss Endocrine ROS: negative for - galactorrhea, hair pattern changes, hot flashes, malaise/lethargy, mood swings, palpitations, polydipsia/polyuria, skin changes, temperature intolerance or unexpected weight changes Breast ROS: negative for - new or changing breast lumps or nipple discharge Respiratory ROS: negative for - cough or shortness of  breath Cardiovascular ROS: negative for - chest pain, irregular heartbeat, palpitations or shortness of breath Gastrointestinal ROS: no abdominal pain, change in bowel habits, or black or bloody stools Genito-Urinary ROS: no dysuria, trouble voiding, or hematuria Musculoskeletal ROS: negative for - joint pain or joint stiffness Neurological ROS: negative for - bowel and bladder control changes Dermatological ROS: negative for rash and skin lesion changes   Objective:   BP 120/74   Pulse 73   Ht 5\' 7"  (1.702 m)   Wt 160 lb (72.6 kg)   LMP 07/21/2015 (Approximate)   BMI 25.06 kg/m  CONSTITUTIONAL: Well-developed, well-nourished female in no acute distress.  PSYCHIATRIC: Normal mood and affect. Normal behavior. Normal judgment and thought content. [place]: Alert and oriented to person, place, and time. Normal muscle tone coordination. No cranial nerve deficit noted. HENT:  Normocephalic, atraumatic, External right and left ear normal. Oropharynx is clear and moist EYES: Conjunctivae and EOM are normal. Pupils are equal, round, and reactive to light. No scleral icterus.  NECK: Normal range of motion, supple, no masses.  Normal thyroid.  SKIN: Skin is warm and dry. No rash noted. Not diaphoretic. No erythema. No pallor. CARDIOVASCULAR: Normal heart rate noted, regular rhythm, no murmur. RESPIRATORY: Clear to auscultation bilaterally. Effort and breath sounds normal, no problems with respiration noted. BREASTS: Symmetric in size. No masses, skin changes, nipple drainage, or lymphadenopathy. ABDOMEN: Soft, normal bowel sounds, no distention noted.  No tenderness, rebound or guarding.  BLADDER: Normal PELVIC:  External Genitalia: Normal  BUS: Normal  Vagina: Normal  Cervix: Normal; no lesions  Uterus: Mid plane to retroverted, bulky, 14 weeks size, mobile, nontender  Adnexa: Normal; nonpalpable and nontender  RV: External Exam NormaI, No Rectal Masses and Normal Sphincter tone   MUSCULOSKELETAL: Normal range of motion. No tenderness.  No cyanosis, clubbing, or edema.  2+ distal pulses. LYMPHATIC: No Axillary, Supraclavicular, or Inguinal Adenopathy.    Assessment:   Annual gynecologic examination 55 y.o. Contraception: none Normal BMI Problem List Items Addressed This Visit    None    Visit Diagnoses    Well woman exam with routine gynecological exam    -  Primary   Uterine leiomyoma, unspecified location         Menopause, symptomatic, not desiring medication Plan:  Pap: Pap Co Test Mammogram: Ordered Stool Guaiac Testing:  Ordered Labs: ., Lipid 1, FBS, TSH, Hemoglobin A1C and Vit D Level"". Routine preventative health maintenance measures emphasized: Exercise/Diet/Weight control, Tobacco Warnings, Alcohol/Substance use risks and Stress Management Return to [place], CMA  Brayton Mars, MD  Note: This dictation was prepared with Dragon dictation along with smaller phrase technology. Any transcriptional errors that result from this process are unintentional.

## 2016-02-03 ENCOUNTER — Encounter: Payer: Self-pay | Admitting: Obstetrics and Gynecology

## 2016-02-03 ENCOUNTER — Ambulatory Visit (INDEPENDENT_AMBULATORY_CARE_PROVIDER_SITE_OTHER): Payer: Managed Care, Other (non HMO) | Admitting: Obstetrics and Gynecology

## 2016-02-03 VITALS — BP 120/74 | HR 73 | Ht 67.0 in | Wt 160.0 lb

## 2016-02-03 DIAGNOSIS — Z1211 Encounter for screening for malignant neoplasm of colon: Secondary | ICD-10-CM

## 2016-02-03 DIAGNOSIS — Z01419 Encounter for gynecological examination (general) (routine) without abnormal findings: Secondary | ICD-10-CM

## 2016-02-03 DIAGNOSIS — N951 Menopausal and female climacteric states: Secondary | ICD-10-CM

## 2016-02-03 DIAGNOSIS — Z780 Asymptomatic menopausal state: Secondary | ICD-10-CM

## 2016-02-03 DIAGNOSIS — Z1231 Encounter for screening mammogram for malignant neoplasm of breast: Secondary | ICD-10-CM

## 2016-02-03 DIAGNOSIS — D259 Leiomyoma of uterus, unspecified: Secondary | ICD-10-CM | POA: Diagnosis not present

## 2016-02-03 NOTE — Patient Instructions (Signed)
1. Pap smear is done 2. Mammogram is ordered 3. Stool guaiac cards are given 4. Screening labs are ordered 5. Recommend calcium with vitamin D supplementation daily 6. Continue with healthy eating and exercise 7. Return in 1 year for annual exam

## 2016-02-08 LAB — PAP IG AND HPV HIGH-RISK
HPV, HIGH-RISK: NEGATIVE
PAP SMEAR COMMENT: 0

## 2016-02-09 ENCOUNTER — Other Ambulatory Visit: Payer: Self-pay

## 2016-02-09 DIAGNOSIS — Z299 Encounter for prophylactic measures, unspecified: Secondary | ICD-10-CM

## 2016-02-09 NOTE — Progress Notes (Signed)
Patient came in to have blood drawn for testing per Dr. Thamas Jaegers authorization.  Patient wants results sent to [facility] when the results are finalized.

## 2016-02-10 LAB — CMP12+LP+TP+TSH+6AC+CBC/D/PLT
ALBUMIN: 4.4 g/dL (ref 3.5–5.5)
ALT: 15 IU/L (ref 0–32)
AST: 17 IU/L (ref 0–40)
Albumin/Globulin Ratio: 1.8 (ref 1.2–2.2)
Alkaline Phosphatase: 68 IU/L (ref 39–117)
BASOS ABS: 0 10*3/uL (ref 0.0–0.2)
BILIRUBIN TOTAL: 0.5 mg/dL (ref 0.0–1.2)
BUN/Creatinine Ratio: 8 — ABNORMAL LOW (ref 9–23)
BUN: 7 mg/dL (ref 6–24)
Basos: 1 %
CHLORIDE: 102 mmol/L (ref 96–106)
CHOLESTEROL TOTAL: 211 mg/dL — AB (ref 100–199)
Calcium: 9.7 mg/dL (ref 8.7–10.2)
Chol/HDL Ratio: 2.9 ratio units (ref 0.0–4.4)
Creatinine, Ser: 0.91 mg/dL (ref 0.57–1.00)
EOS (ABSOLUTE): 0.1 10*3/uL (ref 0.0–0.4)
Eos: 2 %
FREE THYROXINE INDEX: 2.1 (ref 1.2–4.9)
GFR calc non Af Amer: 71 mL/min/{1.73_m2} (ref >59)
GFR, EST AFRICAN AMERICAN: 82 mL/min/{1.73_m2} (ref >59)
GGT: 12 IU/L (ref 0–60)
Globulin, Total: 2.4 g/dL (ref 1.5–4.5)
Glucose: 98 mg/dL (ref 65–99)
HDL: 73 mg/dL (ref >39)
Hematocrit: 45.4 % (ref 34.0–46.6)
Hemoglobin: 15 g/dL (ref 11.1–15.9)
IMMATURE GRANS (ABS): 0 10*3/uL (ref 0.0–0.1)
IMMATURE GRANULOCYTES: 0 %
IRON: 121 ug/dL (ref 27–159)
LDH: 182 IU/L (ref 119–226)
LDL Calculated: 122 mg/dL — ABNORMAL HIGH (ref 0–99)
LYMPHS: 15 %
Lymphocytes Absolute: 1 10*3/uL (ref 0.7–3.1)
MCH: 28.1 pg (ref 26.6–33.0)
MCHC: 33 g/dL (ref 31.5–35.7)
MCV: 85 fL (ref 79–97)
MONOCYTES: 8 %
MONOS ABS: 0.6 10*3/uL (ref 0.1–0.9)
NEUTROS ABS: 4.8 10*3/uL (ref 1.4–7.0)
NEUTROS PCT: 74 %
POTASSIUM: 5.3 mmol/L — AB (ref 3.5–5.2)
Phosphorus: 3.1 mg/dL (ref 2.5–4.5)
Platelets: 225 10*3/uL (ref 150–379)
RBC: 5.34 x10E6/uL — ABNORMAL HIGH (ref 3.77–5.28)
RDW: 16.2 % — AB (ref 12.3–15.4)
Sodium: 142 mmol/L (ref 134–144)
T3 UPTAKE RATIO: 29 % (ref 24–39)
T4 TOTAL: 7.3 ug/dL (ref 4.5–12.0)
TOTAL PROTEIN: 6.8 g/dL (ref 6.0–8.5)
TSH: 2.54 u[IU]/mL (ref 0.450–4.500)
Triglycerides: 82 mg/dL (ref 0–149)
Uric Acid: 5.4 mg/dL (ref 2.5–7.1)
VLDL CHOLESTEROL CAL: 16 mg/dL (ref 5–40)
WBC: 6.5 10*3/uL (ref 3.4–10.8)

## 2016-02-10 LAB — VITAMIN D 25 HYDROXY (VIT D DEFICIENCY, FRACTURES): Vit D, 25-Hydroxy: 28.6 ng/mL — ABNORMAL LOW (ref 30.0–100.0)

## 2016-02-10 LAB — HGB A1C W/O EAG: Hgb A1c MFr Bld: 5.6 % (ref 4.8–5.6)

## 2016-02-12 LAB — FECAL OCCULT BLOOD, IMMUNOCHEMICAL: Fecal Occult Bld: NEGATIVE

## 2016-04-06 ENCOUNTER — Other Ambulatory Visit: Payer: Self-pay | Admitting: Obstetrics and Gynecology

## 2016-04-06 ENCOUNTER — Ambulatory Visit
Admission: RE | Admit: 2016-04-06 | Discharge: 2016-04-06 | Disposition: A | Discharge status: Home / Self Care | Payer: Managed Care, Other (non HMO) | Source: Ambulatory Visit | Attending: Obstetrics and Gynecology | Admitting: Obstetrics and Gynecology

## 2016-04-06 DIAGNOSIS — Z1231 Encounter for screening mammogram for malignant neoplasm of breast: Secondary | ICD-10-CM

## 2016-04-17 ENCOUNTER — Encounter: Payer: Self-pay | Admitting: Physician Assistant

## 2016-04-17 ENCOUNTER — Ambulatory Visit: Payer: Self-pay | Admitting: Physician Assistant

## 2016-04-17 VITALS — BP 115/70 | HR 66 | Temp 97.8°F

## 2016-04-17 DIAGNOSIS — M778 Other enthesopathies, not elsewhere classified: Secondary | ICD-10-CM

## 2016-04-17 DIAGNOSIS — J069 Acute upper respiratory infection, unspecified: Secondary | ICD-10-CM

## 2016-04-17 MED ORDER — AZITHROMYCIN 250 MG PO TABS: ORAL_TABLET | ORAL | 0 refills | Status: DC

## 2016-04-17 MED ORDER — FLUTICASONE PROPIONATE 50 MCG/ACT NA SUSP: 2.0000 | Freq: Every day | NASAL | 6 refills | Status: DC

## 2016-04-17 MED ORDER — MELOXICAM 15 MG PO TABS: 15.0000 mg | ORAL_TABLET | Freq: Every day | ORAL | 5 refills | Status: DC

## 2016-04-17 NOTE — Progress Notes (Signed)
S: C/o runny nose and congestion for 5 days, no fever, chills, cp/sob, v/d; mucus is green and thick, cough is dry, c/o of facial and dental pain. Also having r elbow pain for about a week, states she had to blow leaves for a few hours and the pain started after that, no numbness or tingling, ibuprofen does help  Using otc meds:   O: PE: vitals wnl, nad, perrl eomi, normocephalic, tms dull, nasal mucosa red and swollen, throat injected, neck supple no lymph, lungs c t a, cv rrr, neuro intact, r elbow neg for bony tenderness, full rom, n/v intact  A:  Acute sinusitis, tendonitis   P: drink fluids, continue regular meds , use otc meds of choice, return if not improving in 5 days, return earlier if worsening , zpack, mobic, flonase

## 2016-05-03 ENCOUNTER — Ambulatory Visit: Payer: Self-pay

## 2016-05-30 ENCOUNTER — Ambulatory Visit: Payer: Self-pay | Admitting: Physician Assistant

## 2016-05-30 VITALS — BP 129/85 | HR 74 | Temp 98.2°F

## 2016-05-30 DIAGNOSIS — R0981 Nasal congestion: Secondary | ICD-10-CM

## 2016-05-30 MED ORDER — PREDNISONE 10 MG PO TABS: ORAL_TABLET | ORAL | 0 refills | Status: DC

## 2016-05-30 NOTE — Progress Notes (Signed)
S: c/o of sinus congestion and drainage for several weeks.  Right ear pain.  Coughs first thing in morning and at night.  Using flonase without any complete relief.  Using advil cold and sinus for congestion.  No fever.  O: TMS dull with fluid bilat, nose clear, throat min drainage, neck supple without aden.  Lungs clear bilat.  Viral URI with cough Eustachian tube dysfunction  P:  Prednisone dose pk 6 days

## 2016-06-08 ENCOUNTER — Ambulatory Visit: Payer: Self-pay | Admitting: Physician Assistant

## 2016-06-08 ENCOUNTER — Encounter: Payer: Self-pay | Admitting: Physician Assistant

## 2016-06-08 VITALS — BP 110/70 | HR 105 | Temp 98.5°F

## 2016-06-08 DIAGNOSIS — J069 Acute upper respiratory infection, unspecified: Secondary | ICD-10-CM

## 2016-06-08 DIAGNOSIS — J209 Acute bronchitis, unspecified: Secondary | ICD-10-CM

## 2016-06-08 LAB — POCT INFLUENZA A/B
INFLUENZA A, POC: NEGATIVE
Influenza B, POC: NEGATIVE

## 2016-06-08 MED ORDER — HYDROCOD POLST-CPM POLST ER 10-8 MG/5ML PO SUER: 5.0000 mL | Freq: Two times a day (BID) | ORAL | 0 refills | Status: DC | PRN

## 2016-06-08 MED ORDER — LEVOFLOXACIN 500 MG PO TABS: 500.0000 mg | ORAL_TABLET | Freq: Every day | ORAL | 0 refills | Status: DC

## 2016-06-08 NOTE — Progress Notes (Signed)
S: C/o continued cough and congestion, chest is sore from coughing, no fever, chills, but is achey and tired;  cough is dry and hacking; keeping pt awake at night;  denies cardiac type chest pain or sob, v/d, abd pain Remainder ros neg  O: vitals wnl, nad, tms clear, throat injected, neck supple no lymph, lungs c t a, cv rrr, neuro intact, flu swab neg  A:  Acute bronchitis   P:  rx medication:   levaquin 500mg  qd x 7d, tussionex 173ml nruse otc meds, tylenol or motrin as needed for fever/chills, return if not better in 3 -5 days, return earlier if worsening

## 2016-10-30 ENCOUNTER — Ambulatory Visit: Payer: Self-pay | Admitting: Physician Assistant

## 2016-10-30 ENCOUNTER — Encounter: Payer: Self-pay | Admitting: Physician Assistant

## 2016-10-30 VITALS — BP 110/75 | HR 74 | Temp 98.4°F | Resp 16

## 2016-10-30 DIAGNOSIS — R21 Rash and other nonspecific skin eruption: Secondary | ICD-10-CM

## 2016-10-30 DIAGNOSIS — L259 Unspecified contact dermatitis, unspecified cause: Secondary | ICD-10-CM

## 2016-10-30 MED ORDER — DEXAMETHASONE SODIUM PHOSPHATE 10 MG/ML IJ SOLN
10.0000 mg | Freq: Once | INTRAMUSCULAR | Status: AC
  Administered 2016-10-30: 10 mg via INTRAMUSCULAR

## 2016-10-30 MED ORDER — METHYLPREDNISOLONE 4 MG PO TBPK: ORAL_TABLET | ORAL | 0 refills | Status: DC

## 2016-10-30 NOTE — Progress Notes (Signed)
S: c/o rash on breasts, waist line, flank area, and some on chest, no new exposures, states she has been itching for awhile but the rash showed up about 2 days ago, no drainage or pustules, no fever/chills, can't think of any reason why she would break out  O: Vitals wnl, nad, skin with hive like pink raises areas on sides of both breasts, along left flank, in underwear area, no pustules or drainage, n/v intact  A: acute contact dermatitis, rash  P: decadron 10mg  IM, medrol dose pack, if not better after steroids or if rash returns will order allergen mini panel

## 2016-11-21 ENCOUNTER — Other Ambulatory Visit: Payer: Self-pay

## 2016-11-21 DIAGNOSIS — R21 Rash and other nonspecific skin eruption: Secondary | ICD-10-CM

## 2016-11-21 NOTE — Progress Notes (Signed)
Patient came in to have blood drawn for testing per Susan's authorization. 

## 2016-11-24 LAB — ALLERGEN PROFILE, BASIC FOOD
Allergen Corn, IgE: <0.1 kU/L
Beef IgE: <0.1 kU/L
Egg, Whole IgE: <0.1 kU/L
FOOD MIX (SEAFOODS) IGE: NEGATIVE
Milk IgE: <0.1 kU/L
Pork IgE: <0.1 kU/L
Wheat IgE: <0.1 kU/L

## 2016-11-24 LAB — ALLERGEN PROFILE, MINI-PANEL
D Farinae IgE: <0.1 kU/L
D Pteronyssinus IgE: <0.1 kU/L
Dog Dander IgE: <0.1 kU/L
Elm, American IgE: <0.1 kU/L
Mouse Urine IgE: <0.1 kU/L
Oak, White IgE: <0.1 kU/L

## 2016-11-24 NOTE — Progress Notes (Signed)
I spoke with the patient about her lab results and she expressed understanding.  Patient also expressed that she continues to itch all over.  I notified Manuela Schwartz and she authorized me to call in Atarax 25mg  1 tid # 30 with no refills in to [facility] on [address].

## 2017-02-01 NOTE — Progress Notes (Signed)
ANNUAL PREVENTATIVE CARE GYN  ENCOUNTER NOTE  Subjective:       Caitlyn [name] is a 56 y.o. G86P2002 female here for a routine annual gynecologic exam.  Current complaints: 1. lmp- 07/2015; had spotting last week  2. Wants iron checked h/o of anemia  Bowel and bladder function are normal. Spotting last week was just minimal isolated discharge. She denies pelvic pain.  Gynecologic History No LMP recorded. Patient is not currently having periods (Reason: Perimenopausal). Contraception: none Last Pap: 02/04/2016 neg/neg . Results were: normal Last mammogram: 04/06/2016 birad 1 Results were: normal  Obstetric History OB History  Gravida Para Term Preterm AB Living  2 2 2     2   SAB TAB Ectopic Multiple Live Births          2    # Outcome Date GA Lbr Len/2nd Weight Sex Delivery Anes PTL Lv  2 Term 1992   7 lb 1.8 oz (3.225 kg) F Vag-Spont   LIV  1 Term 1985   9 lb (4.082 kg) M Vag-Spont   LIV      Past Medical History:  Diagnosis Date  . Abnormal uterine bleeding (AUB)   . Anemia   . H/O vitamin D deficiency   . Menopause   . Uterine leiomyoma     Past Surgical History:  Procedure Laterality Date  . ENDOMETRIAL BIOPSY  08/13/2014   weakly proliferative endometrium- no hyperplasia or carcinoma  . TONSILLECTOMY  1960s    Current Outpatient Prescriptions on File Prior to Visit  Medication Sig Dispense Refill  . chlorpheniramine-HYDROcodone (TUSSIONEX PENNKINETIC ER) 10-8 MG/5ML SUER Take 5 mLs by mouth every 12 (twelve) hours as needed for cough. (Patient not taking: Reported on 10/30/2016) 150 mL 0  . Cholecalciferol (VITAMIN D) 2000 UNITS tablet Take 2,000 Units by mouth daily.    . ferrous sulfate 325 (65 FE) MG tablet Take 325 mg by mouth 2 (two) times daily.    . fluticasone (FLONASE) 50 MCG/ACT nasal spray Place 2 sprays into both nostrils daily. (Patient not taking: Reported on 10/30/2016) 16 g 6  . levofloxacin (LEVAQUIN) 500 MG tablet Take 1 tablet (500 mg total) by mouth  daily. (Patient not taking: Reported on 10/30/2016) 7 tablet 0  . meloxicam (MOBIC) 15 MG tablet Take 1 tablet (15 mg total) by mouth daily. (Patient not taking: Reported on 10/30/2016) 30 tablet 5  . methylPREDNISolone (MEDROL DOSEPAK) 4 MG TBPK tablet Take 6 pills on day one then decrease by 1 pill each day 21 tablet 0   No current facility-administered medications on file prior to visit.     No Known Allergies  Social History   Social History  . Marital status: Married    Spouse name: N/A  . Number of children: 2  . Years of education: 14   Occupational History  . Deputy Engineer, [employer] [place] History Main Topics  . Smoking status: Former Smoker    Years: 10.00    Quit date: 05/08/1992  . Smokeless tobacco: Never Used  . Alcohol use Yes     Comment: rare  . Drug use: Yes  . Sexual activity: Not Currently   Other Topics Concern  . Not on file   Social History Narrative   Caitlyn [name] was born and reared in [place]. She graduated from [employer] (now [employer]) in 1982 with a Business degree. 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 She lives at home with her daughter. Caitlyn [name] has been separated for approximately 10 years. She enjoys spending time with friends. She enjoys sports - baseball, football.    Family History  Problem Relation Age of Onset  . Hypertension Mother   . Diabetes Mother   . Heart disease Mother   . Hypertension Father   . Diabetes Father   . Pancreatic cancer Father   . Hemochromatosis Brother   . Cancer Neg Hx   . Breast cancer Neg Hx     The following portions of the patient's history were reviewed and updated as appropriate: allergies, current medications, past family history, past medical history, past social history, past surgical history and problem list.  Review of Systems Review of Systems  Constitutional:       History of vasomotor symptoms; tends to stay  warm  HENT: Negative.   Eyes: Negative.   Respiratory: Negative.   Cardiovascular: Negative.   Gastrointestinal: Negative.   Genitourinary: Negative.        Vaginal spotting last week  Musculoskeletal: Negative.   Skin: Negative.   Neurological: Negative.   Endo/Heme/Allergies: Negative.   Psychiatric/Behavioral: Negative.      Objective:   BP 127/85   Pulse 80   Ht 5\' 7"  (1.702 m)   Wt 162 lb 8 oz (73.7 kg)   LMP 02/01/2017 Comment: spotting only  BMI 25.45 kg/m  CONSTITUTIONAL: Well-developed, well-nourished female in no acute distress.  PSYCHIATRIC: Normal mood and affect. Normal behavior. Normal judgment and thought content. Caitlyn [name]: Alert and oriented to person, place, and time. Normal muscle tone coordination. No cranial nerve deficit noted. HENT:  Normocephalic, atraumatic, External right and left ear normal. Oropharynx is clear and moist EYES: Conjunctivae and EOM are normal.No scleral icterus.  NECK: Normal range of motion, supple, no masses.  Normal thyroid.  SKIN: Skin is warm and dry. No rash noted. Not diaphoretic. No erythema. No pallor. CARDIOVASCULAR: Normal heart rate noted, regular rhythm, no murmur. RESPIRATORY: Clear to auscultation bilaterally. Effort and breath sounds normal, no problems with respiration noted. BREASTS: Symmetric in size. No masses, skin changes, nipple drainage, or lymphadenopathy. ABDOMEN: Soft, normal bowel sounds, no distention noted.  No tenderness, rebound or guarding.  BLADDER: Normal PELVIC:  External Genitalia: Normal  BUS: Normal  Vagina: Normal  Cervix: Normal; no lesions; no cervical motion tenderness  Uterus: Mid plane to retroverted, bulky, 12 weeks size, mobile, nontender  Adnexa: Normal; nonpalpable and nontender  RV: External Exam NormaI, No Rectal Masses and Normal Sphincter tone  MUSCULOSKELETAL: Normal range of motion. No tenderness.  No cyanosis, clubbing, or edema.  2+ distal pulses. LYMPHATIC: No Axillary,  Supraclavicular, or Inguinal Adenopathy.    Assessment:   Annual gynecologic examination 56 y.o. Contraception: none Normal BMI Problem List Items Addressed This Visit    None    Visit Diagnoses    Well woman exam with routine gynecological exam    -  Primary   Encounter for screening mammogram for breast cancer       Screening for colon cancer       Uterine leiomyoma, unspecified location       Menopause        Postmenopausal bleeding Menopause, symptomatic, not desiring medication Plan:  Pap: Due 2020 Mammogram: Ordered Stool Guaiac Testing:  Ordered Labs: CBC, Lipid 1, FBS, TSH and Hemoglobin A1C Routine preventative health maintenance measures emphasized: Exercise/Diet/Weight control, Tobacco Warnings, Alcohol/Substance use risks and Stress Management Return to  Clinic - 1 Year Pelvic ultrasound Return for endometrial biopsy if endometrial stripe is greater than 4 mm   Joyice Faster, CMA  Brayton Mars, MD   Note: This dictation was prepared with Dragon dictation along with smaller phrase technology. Any transcriptional errors that result from this process are unintentional.

## 2017-02-06 ENCOUNTER — Ambulatory Visit (INDEPENDENT_AMBULATORY_CARE_PROVIDER_SITE_OTHER): Payer: Managed Care, Other (non HMO) | Admitting: Obstetrics and Gynecology

## 2017-02-06 ENCOUNTER — Encounter: Payer: Self-pay | Admitting: Obstetrics and Gynecology

## 2017-02-06 VITALS — BP 127/85 | HR 80 | Ht 67.0 in | Wt 162.5 lb

## 2017-02-06 DIAGNOSIS — D259 Leiomyoma of uterus, unspecified: Secondary | ICD-10-CM

## 2017-02-06 DIAGNOSIS — Z780 Asymptomatic menopausal state: Secondary | ICD-10-CM | POA: Insufficient documentation

## 2017-02-06 DIAGNOSIS — Z1211 Encounter for screening for malignant neoplasm of colon: Secondary | ICD-10-CM | POA: Diagnosis not present

## 2017-02-06 DIAGNOSIS — N950 Postmenopausal bleeding: Secondary | ICD-10-CM | POA: Diagnosis not present

## 2017-02-06 DIAGNOSIS — Z1231 Encounter for screening mammogram for malignant neoplasm of breast: Secondary | ICD-10-CM

## 2017-02-06 DIAGNOSIS — Z862 Personal history of diseases of the blood and blood-forming organs and certain disorders involving the immune mechanism: Secondary | ICD-10-CM | POA: Insufficient documentation

## 2017-02-06 DIAGNOSIS — Z01419 Encounter for gynecological examination (general) (routine) without abnormal findings: Secondary | ICD-10-CM | POA: Diagnosis not present

## 2017-02-06 NOTE — Patient Instructions (Addendum)
1. No Pap smear today. Next Pap smear is due in 2020. 2. Mammogram is ordered 3. Stool guaiac card testing is ordered for colon cancer screening 4. Screening labs are ordered 5. Continue with healthy eating and exercise 6. Recommend calcium with vitamin D supplementation-1200 mg/800 international units daily 7. Return in 1 year for annual exam 8. Ultrasound is scheduled to assess postmenopausal bleeding. If biopsy is needed, you will be contacted through my chart.   Health Maintenance for Postmenopausal Women Menopause is a normal process in which your reproductive ability comes to an end. This process happens gradually over a span of months to years, usually between the ages of 73 and 45. Menopause is complete when you have missed 12 consecutive menstrual periods. It is important to talk with your health care provider about some of the most common conditions that affect postmenopausal women, such as heart disease, cancer, and bone loss (osteoporosis). Adopting a healthy lifestyle and getting preventive care can help to promote your health and wellness. Those actions can also lower your chances of developing some of these common conditions. What should I know about menopause? During menopause, you may experience a number of symptoms, such as:  Moderate-to-severe hot flashes.  Night sweats.  Decrease in sex drive.  Mood swings.  Headaches.  Tiredness.  Irritability.  Memory problems.  Insomnia.  Choosing to treat or not to treat menopausal changes is an individual decision that you make with your health care provider. What should I know about hormone replacement therapy and supplements? Hormone therapy products are effective for treating symptoms that are associated with menopause, such as hot flashes and night sweats. Hormone replacement carries certain risks, especially as you become older. If you are thinking about using estrogen or estrogen with progestin treatments, discuss  the benefits and risks with your health care provider. What should I know about heart disease and stroke? Heart disease, heart attack, and stroke become more likely as you age. This may be due, in part, to the hormonal changes that your body experiences during menopause. These can affect how your body processes dietary fats, triglycerides, and cholesterol. Heart attack and stroke are both medical emergencies. There are many things that you can do to help prevent heart disease and stroke:  Have your blood pressure checked at least every 1-2 years. High blood pressure causes heart disease and increases the risk of stroke.  If you are 80-16 years old, ask your health care provider if you should take aspirin to prevent a heart attack or a stroke.  Do not use any tobacco products, including cigarettes, chewing tobacco, or electronic cigarettes. If you need help quitting, ask your health care provider.  It is important to eat a healthy diet and maintain a healthy weight. ? Be sure to include plenty of vegetables, fruits, low-fat dairy products, and lean protein. ? Avoid eating foods that are high in solid fats, added sugars, or salt (sodium).  Get regular exercise. This is one of the most important things that you can do for your health. ? Try to exercise for at least 150 minutes each week. The type of exercise that you do should increase your heart rate and make you sweat. This is known as moderate-intensity exercise. ? Try to do strengthening exercises at least twice each week. Do these in addition to the moderate-intensity exercise.  Know your numbers.Ask your health care provider to check your cholesterol and your blood glucose. Continue to have your blood tested as directed by  your health care provider.  What should I know about cancer screening? There are several types of cancer. Take the following steps to reduce your risk and to catch any cancer development as early as possible. Breast  Cancer  Practice breast self-awareness. ? This means understanding how your breasts normally appear and feel. ? It also means doing regular breast self-exams. Let your health care provider know about any changes, no matter how small.  If you are 40 or older, have a clinician do a breast exam (clinical breast exam or CBE) every year. Depending on your age, family history, and medical history, it may be recommended that you also have a yearly breast X-ray (mammogram).  If you have a family history of breast cancer, talk with your health care provider about genetic screening.  If you are at high risk for breast cancer, talk with your health care provider about having an MRI and a mammogram every year.  Breast cancer (BRCA) gene test is recommended for women who have family members with BRCA-related cancers. Results of the assessment will determine the need for genetic counseling and BRCA1 and for BRCA2 testing. BRCA-related cancers include these types: ? Breast. This occurs in males or females. ? Ovarian. ? Tubal. This may also be called fallopian tube cancer. ? Cancer of the abdominal or pelvic lining (peritoneal cancer). ? Prostate. ? Pancreatic.  Cervical, Uterine, and Ovarian Cancer Your health care provider may recommend that you be screened regularly for cancer of the pelvic organs. These include your ovaries, uterus, and vagina. This screening involves a pelvic exam, which includes checking for microscopic changes to the surface of your cervix (Pap test).  For women ages 21-65, health care providers may recommend a pelvic exam and a Pap test every three years. For women ages 19-65, they may recommend the Pap test and pelvic exam, combined with testing for human papilloma virus (HPV), every five years. Some types of HPV increase your risk of cervical cancer. Testing for HPV may also be done on women of any age who have unclear Pap test results.  Other health care providers may not  recommend any screening for nonpregnant women who are considered low risk for pelvic cancer and have no symptoms. Ask your health care provider if a screening pelvic exam is right for you.  If you have had past treatment for cervical cancer or a condition that could lead to cancer, you need Pap tests and screening for cancer for at least 20 years after your treatment. If Pap tests have been discontinued for you, your risk factors (such as having a new sexual partner) need to be reassessed to determine if you should start having screenings again. Some women have medical problems that increase the chance of getting cervical cancer. In these cases, your health care provider may recommend that you have screening and Pap tests more often.  If you have a family history of uterine cancer or ovarian cancer, talk with your health care provider about genetic screening.  If you have vaginal bleeding after reaching menopause, tell your health care provider.  There are currently no reliable tests available to screen for ovarian cancer.  Lung Cancer Lung cancer screening is recommended for adults 57-28 years old who are at high risk for lung cancer because of a history of smoking. A yearly low-dose CT scan of the lungs is recommended if you:  Currently smoke.  Have a history of at least 30 pack-years of smoking and you currently smoke or  have quit within the past 15 years. A pack-year is smoking an average of one pack of cigarettes per day for one year.  Yearly screening should:  Continue until it has been 15 years since you quit.  Stop if you develop a health problem that would prevent you from having lung cancer treatment.  Colorectal Cancer  This type of cancer can be detected and can often be prevented.  Routine colorectal cancer screening usually begins at age 73 and continues through age 72.  If you have risk factors for colon cancer, your health care provider may recommend that you be screened  at an earlier age.  If you have a family history of colorectal cancer, talk with your health care provider about genetic screening.  Your health care provider may also recommend using home test kits to check for hidden blood in your stool.  A small camera at the end of a tube can be used to examine your colon directly (sigmoidoscopy or colonoscopy). This is done to check for the earliest forms of colorectal cancer.  Direct examination of the colon should be repeated every 5-10 years until age 13. However, if early forms of precancerous polyps or small growths are found or if you have a family history or genetic risk for colorectal cancer, you may need to be screened more often.  Skin Cancer  Check your skin from head to toe regularly.  Monitor any moles. Be sure to tell your health care provider: ? About any new moles or changes in moles, especially if there is a change in a mole's shape or color. ? If you have a mole that is larger than the size of a pencil eraser.  If any of your family members has a history of skin cancer, especially at a young age, talk with your health care provider about genetic screening.  Always use sunscreen. Apply sunscreen liberally and repeatedly throughout the day.  Whenever you are outside, protect yourself by wearing long sleeves, pants, a wide-brimmed hat, and sunglasses.  What should I know about osteoporosis? Osteoporosis is a condition in which bone destruction happens more quickly than new bone creation. After menopause, you may be at an increased risk for osteoporosis. To help prevent osteoporosis or the bone fractures that can happen because of osteoporosis, the following is recommended:  If you are 79-39 years old, get at least 1,000 mg of calcium and at least 600 mg of vitamin D per day.  If you are older than age 85 but younger than age 69, get at least 1,200 mg of calcium and at least 600 mg of vitamin D per day.  If you are older than age 68,  get at least 1,200 mg of calcium and at least 800 mg of vitamin D per day.  Smoking and excessive alcohol intake increase the risk of osteoporosis. Eat foods that are rich in calcium and vitamin D, and do weight-bearing exercises several times each week as directed by your health care provider. What should I know about how menopause affects my mental health? Depression may occur at any age, but it is more common as you become older. Common symptoms of depression include:  Low or sad mood.  Changes in sleep patterns.  Changes in appetite or eating patterns.  Feeling an overall lack of motivation or enjoyment of activities that you previously enjoyed.  Frequent crying spells.  Talk with your health care provider if you think that you are experiencing depression. What should I know  about immunizations? It is important that you get and maintain your immunizations. These include:  Tetanus, diphtheria, and pertussis (Tdap) booster vaccine.  Influenza every year before the flu season begins.  Pneumonia vaccine.  Shingles vaccine.  Your health care provider may also recommend other immunizations. This information is not intended to replace advice given to you by your health care provider. Make sure you discuss any questions you have with your health care provider. Document Released: 06/16/2005 Document Revised: 11/12/2015 Document Reviewed: 01/26/2015 Elsevier Interactive Patient Education  2018 [employer].

## 2017-02-07 ENCOUNTER — Other Ambulatory Visit: Payer: Self-pay

## 2017-02-07 ENCOUNTER — Ambulatory Visit (INDEPENDENT_AMBULATORY_CARE_PROVIDER_SITE_OTHER): Payer: Managed Care, Other (non HMO)

## 2017-02-07 DIAGNOSIS — N950 Postmenopausal bleeding: Secondary | ICD-10-CM

## 2017-02-07 DIAGNOSIS — Z299 Encounter for prophylactic measures, unspecified: Secondary | ICD-10-CM

## 2017-02-07 NOTE — Progress Notes (Signed)
Patient came in to have blood drawn for testing per Dr. Thamas Jaegers orders.

## 2017-02-08 LAB — CMP12+LP+TP+TSH+6AC+CBC/D/PLT
ALT: 12 IU/L (ref 0–32)
AST: 18 IU/L (ref 0–40)
Albumin/Globulin Ratio: 1.8 (ref 1.2–2.2)
Albumin: 4.6 g/dL (ref 3.5–5.5)
Alkaline Phosphatase: 84 IU/L (ref 39–117)
BUN/Creatinine Ratio: 11 (ref 9–23)
BUN: 11 mg/dL (ref 6–24)
Basophils Absolute: 0.1 10*3/uL (ref 0.0–0.2)
Basos: 1 %
Bilirubin Total: 0.5 mg/dL (ref 0.0–1.2)
CALCIUM: 9.5 mg/dL (ref 8.7–10.2)
CHOL/HDL RATIO: 3.2 ratio (ref 0.0–4.4)
CREATININE: 1.02 mg/dL — AB (ref 0.57–1.00)
Chloride: 102 mmol/L (ref 96–106)
Cholesterol, Total: 220 mg/dL — ABNORMAL HIGH (ref 100–199)
EOS (ABSOLUTE): 0.1 10*3/uL (ref 0.0–0.4)
Eos: 1 %
Estimated CHD Risk: <0.5 times avg. (ref 0.0–1.0)
Free Thyroxine Index: 2.7 (ref 1.2–4.9)
GFR calc Af Amer: 71 mL/min/{1.73_m2} (ref >59)
GFR, EST NON AFRICAN AMERICAN: 62 mL/min/{1.73_m2} (ref >59)
GGT: 15 IU/L (ref 0–60)
GLOBULIN, TOTAL: 2.5 g/dL (ref 1.5–4.5)
Glucose: 81 mg/dL (ref 65–99)
HDL: 69 mg/dL (ref >39)
Hematocrit: 46.6 % (ref 34.0–46.6)
Hemoglobin: 15.4 g/dL (ref 11.1–15.9)
Immature Grans (Abs): 0 10*3/uL (ref 0.0–0.1)
Immature Granulocytes: 0 %
Iron: 107 ug/dL (ref 27–159)
LDH: 220 IU/L (ref 119–226)
LDL Calculated: 135 mg/dL — ABNORMAL HIGH (ref 0–99)
LYMPHS ABS: 1.1 10*3/uL (ref 0.7–3.1)
Lymphs: 16 %
MCH: 29.7 pg (ref 26.6–33.0)
MCHC: 33 g/dL (ref 31.5–35.7)
MCV: 90 fL (ref 79–97)
MONOS ABS: 0.6 10*3/uL (ref 0.1–0.9)
Monocytes: 9 %
NEUTROS ABS: 4.9 10*3/uL (ref 1.4–7.0)
Neutrophils: 73 %
PHOSPHORUS: 3 mg/dL (ref 2.5–4.5)
POTASSIUM: 4 mmol/L (ref 3.5–5.2)
Platelets: 211 10*3/uL (ref 150–379)
RBC: 5.19 x10E6/uL (ref 3.77–5.28)
RDW: 14.8 % (ref 12.3–15.4)
Sodium: 140 mmol/L (ref 134–144)
T3 Uptake Ratio: 31 % (ref 24–39)
T4 TOTAL: 8.8 ug/dL (ref 4.5–12.0)
TRIGLYCERIDES: 79 mg/dL (ref 0–149)
TSH: 2.48 u[IU]/mL (ref 0.450–4.500)
Total Protein: 7.1 g/dL (ref 6.0–8.5)
Uric Acid: 4.6 mg/dL (ref 2.5–7.1)
VLDL Cholesterol Cal: 16 mg/dL (ref 5–40)
WBC: 6.7 10*3/uL (ref 3.4–10.8)

## 2017-02-08 LAB — HGB A1C W/O EAG: HEMOGLOBIN A1C: 5.4 % (ref 4.8–5.6)

## 2017-02-13 LAB — FECAL OCCULT BLOOD, IMMUNOCHEMICAL: Fecal Occult Bld: NEGATIVE

## 2017-04-18 ENCOUNTER — Ambulatory Visit
Admission: RE | Admit: 2017-04-18 | Discharge: 2017-04-18 | Disposition: A | Discharge status: Home / Self Care | Payer: Managed Care, Other (non HMO) | Source: Ambulatory Visit | Attending: Obstetrics and Gynecology | Admitting: Obstetrics and Gynecology

## 2017-04-18 ENCOUNTER — Other Ambulatory Visit: Payer: Self-pay | Admitting: Obstetrics and Gynecology

## 2017-04-18 DIAGNOSIS — R928 Other abnormal and inconclusive findings on diagnostic imaging of breast: Secondary | ICD-10-CM | POA: Insufficient documentation

## 2017-04-18 DIAGNOSIS — Z1231 Encounter for screening mammogram for malignant neoplasm of breast: Secondary | ICD-10-CM

## 2017-04-20 ENCOUNTER — Other Ambulatory Visit: Payer: Self-pay | Admitting: Obstetrics and Gynecology

## 2017-04-20 DIAGNOSIS — R928 Other abnormal and inconclusive findings on diagnostic imaging of breast: Secondary | ICD-10-CM

## 2017-04-20 DIAGNOSIS — N6489 Other specified disorders of breast: Secondary | ICD-10-CM

## 2017-04-25 ENCOUNTER — Ambulatory Visit
Admission: RE | Admit: 2017-04-25 | Discharge: 2017-04-25 | Disposition: A | Discharge status: Home / Self Care | Payer: Managed Care, Other (non HMO) | Source: Ambulatory Visit | Attending: Obstetrics and Gynecology | Admitting: Obstetrics and Gynecology

## 2017-04-25 DIAGNOSIS — R928 Other abnormal and inconclusive findings on diagnostic imaging of breast: Secondary | ICD-10-CM

## 2017-04-25 DIAGNOSIS — N6489 Other specified disorders of breast: Secondary | ICD-10-CM | POA: Diagnosis not present

## 2017-04-26 ENCOUNTER — Other Ambulatory Visit: Payer: Self-pay

## 2017-04-26 ENCOUNTER — Ambulatory Visit: Payer: Managed Care, Other (non HMO)

## 2017-05-29 ENCOUNTER — Ambulatory Visit: Payer: Self-pay | Admitting: Emergency Medicine

## 2017-05-29 VITALS — BP 100/80 | HR 71 | Temp 99.3°F | Resp 16

## 2017-05-29 DIAGNOSIS — J0101 Acute recurrent maxillary sinusitis: Secondary | ICD-10-CM

## 2017-05-29 MED ORDER — AMOXICILLIN 875 MG PO TABS: 875.0000 mg | ORAL_TABLET | Freq: Two times a day (BID) | ORAL | 0 refills | Status: DC

## 2017-05-29 NOTE — Progress Notes (Signed)
Subjective.  Patient enters with a 2 day history of facial congestion ear pain and fever. She is unable to breathe due to nasal congestion and feels significant pressure in her sinuses. She has a history of sinus infections approximately 2 times a year. She has had a mild cough but it has not been significant. She has been taking Advil Cold and Sinus. Objective. HEENT exam. Pupils equal reactive to light. TMs are clear bilaterally. Nose is congestion and raw appearing. Throat appears normal without drainage. Neck is supple without adenopathy. Chest clear to auscultation. Heart regular rate and rhythm. Assessment. Patient has an upper Restoril infection with bilateral maxillary sinusitis. Plan. #1 continue Advil cold and sinus. #2 saline spray netti  pot. #3 amoxicillin 875 twice a day #20.

## 2017-05-29 NOTE — Patient Instructions (Signed)

## 2017-06-12 ENCOUNTER — Ambulatory Visit: Payer: Self-pay | Admitting: Family Medicine

## 2017-06-12 VITALS — BP 110/70 | HR 87 | Temp 98.5°F | Resp 16

## 2017-06-12 DIAGNOSIS — J069 Acute upper respiratory infection, unspecified: Secondary | ICD-10-CM

## 2017-06-12 DIAGNOSIS — H6983 Other specified disorders of Eustachian tube, bilateral: Secondary | ICD-10-CM

## 2017-06-12 MED ORDER — PREDNISONE 20 MG PO TABS: ORAL_TABLET | ORAL | 0 refills | Status: DC

## 2017-06-12 MED ORDER — FLUTICASONE PROPIONATE 50 MCG/ACT NA SUSP: 2.0000 | Freq: Every day | NASAL | 0 refills | Status: DC

## 2017-06-12 NOTE — Progress Notes (Signed)
Patient ID: Caitlyn [name], female    DOB: [birth year]  Age: 57 y.o. MRN: [id number]  Chief Complaint  Patient presents with  . Sinusitis    Subjective:   Treated for a sinusitis and headache a couple weeks ago.  Since then has persisted with rhinorrhea and congestion.  Now has pressure sensation in her ears and feels like she is down in a well.  She does not smoke.  She is not having a bad headache down.  Does cough some.  Takes Tylenol flu and sinus.  Current allergies, medications, problem list, past/family and social histories reviewed.  Objective:  BP 110/70   Pulse 87   Temp 98.5 F (36.9 C) (Oral)   Resp 16   SpO2 99%   No major acute distress.  TMs normal on the right, a little dull on the left.  There is proximal of fluid behind the drum.  Sinuses nontender.  Nose running.  Throat clear.  Neck supple without nodes.  Chest clear to auscultation.  Heart regular without murmur.  Assessment & Plan:   Assessment: 1. Dysfunction of both eustachian tubes   2. Acute upper respiratory infection       Plan: We will treat for eustachian tube dysfunction.  The prednisone will also help the sinuses are clear on upper hope.  No orders of the defined types were placed in this encounter.   Meds ordered this encounter  Medications  . predniSONE (DELTASONE) 20 MG tablet    Sig: Take 3 pills daily for 2 days, then 2 daily for 2 days, then 1 daily for 2 days for eustachian tube dysfunction    Dispense:  12 tablet    Refill:  0  . fluticasone (FLONASE) 50 MCG/ACT nasal spray    Sig: Place 2 sprays into both nostrils daily.    Dispense:  16 g    Refill:  0         Patient Instructions  Drink plenty of fluids and get enough rest  Use the fluticasone spray 2 sprays each nostril twice daily for 4 days, then decrease to once daily.  Take the prednisone 20 mg 3 pills daily for 2 days, then 2 daily for 2 days, then 1 daily for 2 days  Continue taking the Tylenol fluids sinus if  you need to for pain or congestion.  See your family physician or return if worse   Eustachian Tube Dysfunction The eustachian tube connects the middle ear to the back of the nose. It regulates air pressure in the middle ear by allowing air to move between the ear and nose. It also helps to drain fluid from the middle ear space. When the eustachian tube does not function properly, air pressure, fluid, or both can build up in the middle ear. Eustachian tube dysfunction can affect one or both ears. What are the causes? This condition happens when the eustachian tube becomes blocked or cannot open normally. This may result from:  Ear infections.  Colds and other upper respiratory infections.  Allergies.  Irritation, such as from cigarette smoke or acid from the stomach coming up into the esophagus (gastroesophageal reflux).  Sudden changes in air pressure, such as from descending in an airplane.  Abnormal growths in the nose or throat, such as nasal polyps, tumors, or enlarged tissue at the back of the throat (adenoids).  What increases the risk? This condition may be more likely to develop in people who smoke and people who are overweight. Eustachian  tube dysfunction may also be more likely to develop in children, especially children who have:  Certain birth defects of the mouth, such as cleft palate.  Large tonsils and adenoids.  What are the signs or symptoms? Symptoms of this condition may include:  A feeling of fullness in the ear.  Ear pain.  Clicking or popping noises in the ear.  Ringing in the ear.  Hearing loss.  Loss of balance.  Symptoms may get worse when the air pressure around you changes, such as when you travel to an area of high elevation or fly on an airplane. How is this diagnosed? This condition may be diagnosed based on:  Your symptoms.  A physical exam of your ear, nose, and throat.  Tests, such as those that measure: ? The movement of your  eardrum (tympanogram). ? Your hearing (audiometry).  How is this treated? Treatment depends on the cause and severity of your condition. If your symptoms are mild, you may be able to relieve your symptoms by moving air into ("popping") your ears. If you have symptoms of fluid in your ears, treatment may include:  Decongestants.  Antihistamines.  Nasal sprays or ear drops that contain medicines that reduce swelling (steroids).  In some cases, you may need to have a procedure to drain the fluid in your eardrum (myringotomy). In this procedure, a small tube is placed in the eardrum to:  Drain the fluid.  Restore the air in the middle ear space.  Follow these instructions at home:  Take over-the-counter and prescription medicines only as told by your health care provider.  Use techniques to help pop your ears as recommended by your health care provider. These may include: ? Chewing gum. ? Yawning. ? Frequent, forceful swallowing. ? Closing your mouth, holding your nose closed, and gently blowing as if you are trying to blow air out of your nose.  Do not do any of the following until your health care provider approves: ? Travel to high altitudes. ? Fly in airplanes. ? Work in a Pension scheme manager or room. ? Scuba dive.  Keep your ears dry. Dry your ears completely after showering or bathing.  Do not smoke.  Keep all follow-up visits as told by your health care provider. This is important. Contact a health care provider if:  Your symptoms do not go away after treatment.  Your symptoms come back after treatment.  You are unable to pop your ears.  You have: ? A fever. ? Pain in your ear. ? Pain in your head or neck. ? Fluid draining from your ear.  Your hearing suddenly changes.  You become very dizzy.  You lose your balance. This information is not intended to replace advice given to you by your health care provider. Make sure you discuss any questions you have with  your health care provider. Document Released: 05/21/2015 Document Revised: 09/30/2015 Document Reviewed: 05/13/2014 Elsevier Interactive Patient Education  [employer].     Return if symptoms worsen or fail to improve.   HOPPER,DAVID, MD 06/12/2017

## 2017-06-12 NOTE — Patient Instructions (Addendum)
Drink plenty of fluids and get enough rest  Use the fluticasone spray 2 sprays each nostril twice daily for 4 days, then decrease to once daily.  Take the prednisone 20 mg 3 pills daily for 2 days, then 2 daily for 2 days, then 1 daily for 2 days  Continue taking the Tylenol fluids sinus if you need to for pain or congestion.  See your family physician or return if worse   Eustachian Tube Dysfunction The eustachian tube connects the middle ear to the back of the nose. It regulates air pressure in the middle ear by allowing air to move between the ear and nose. It also helps to drain fluid from the middle ear space. When the eustachian tube does not function properly, air pressure, fluid, or both can build up in the middle ear. Eustachian tube dysfunction can affect one or both ears. What are the causes? This condition happens when the eustachian tube becomes blocked or cannot open normally. This may result from:  Ear infections.  Colds and other upper respiratory infections.  Allergies.  Irritation, such as from cigarette smoke or acid from the stomach coming up into the esophagus (gastroesophageal reflux).  Sudden changes in air pressure, such as from descending in an airplane.  Abnormal growths in the nose or throat, such as nasal polyps, tumors, or enlarged tissue at the back of the throat (adenoids).  What increases the risk? This condition may be more likely to develop in people who smoke and people who are overweight. Eustachian tube dysfunction may also be more likely to develop in children, especially children who have:  Certain birth defects of the mouth, such as cleft palate.  Large tonsils and adenoids.  What are the signs or symptoms? Symptoms of this condition may include:  A feeling of fullness in the ear.  Ear pain.  Clicking or popping noises in the ear.  Ringing in the ear.  Hearing loss.  Loss of balance.  Symptoms may get worse when the air  pressure around you changes, such as when you travel to an area of high elevation or fly on an airplane. How is this diagnosed? This condition may be diagnosed based on:  Your symptoms.  A physical exam of your ear, nose, and throat.  Tests, such as those that measure: ? The movement of your eardrum (tympanogram). ? Your hearing (audiometry).  How is this treated? Treatment depends on the cause and severity of your condition. If your symptoms are mild, you may be able to relieve your symptoms by moving air into ("popping") your ears. If you have symptoms of fluid in your ears, treatment may include:  Decongestants.  Antihistamines.  Nasal sprays or ear drops that contain medicines that reduce swelling (steroids).  In some cases, you may need to have a procedure to drain the fluid in your eardrum (myringotomy). In this procedure, a small tube is placed in the eardrum to:  Drain the fluid.  Restore the air in the middle ear space.  Follow these instructions at home:  Take over-the-counter and prescription medicines only as told by your health care provider.  Use techniques to help pop your ears as recommended by your health care provider. These may include: ? Chewing gum. ? Yawning. ? Frequent, forceful swallowing. ? Closing your mouth, holding your nose closed, and gently blowing as if you are trying to blow air out of your nose.  Do not do any of the following until your health care provider approves: ?  Travel to high altitudes. ? Fly in airplanes. ? Work in a Pension scheme manager or room. ? Scuba dive.  Keep your ears dry. Dry your ears completely after showering or bathing.  Do not smoke.  Keep all follow-up visits as told by your health care provider. This is important. Contact a health care provider if:  Your symptoms do not go away after treatment.  Your symptoms come back after treatment.  You are unable to pop your ears.  You have: ? A fever. ? Pain in  your ear. ? Pain in your head or neck. ? Fluid draining from your ear.  Your hearing suddenly changes.  You become very dizzy.  You lose your balance. This information is not intended to replace advice given to you by your health care provider. Make sure you discuss any questions you have with your health care provider. Document Released: 05/21/2015 Document Revised: 09/30/2015 Document Reviewed: 05/13/2014 Elsevier Interactive Patient Education  [employer].

## 2017-10-05 ENCOUNTER — Ambulatory Visit: Payer: Self-pay

## 2018-01-31 NOTE — Progress Notes (Signed)
ANNUAL PREVENTATIVE CARE GYN  ENCOUNTER NOTE  Subjective:       Caitlyn [name] is a 57 y.o. G64P2002 female here for a routine annual gynecologic exam.  Current complaints:  1. Fluid I ears?????;  Patient has had some sinus drainage and previous diagnosis of ear infection with fluid behind the ears treated with prednisone; no fever chills or sweats; no cough  Bowel and bladder function are normal. No major interval health issues are identified. No sexual complaints. Gynecologic History LMP- postmenopausal Contraception: none Last Pap: 02/04/2016 neg/neg . Results were: normal Last mammogram: 04/2017  birad 1 Results were: normal  Obstetric History OB History  Gravida Para Term Preterm AB Living  2 2 2     2   SAB TAB Ectopic Multiple Live Births          2    # Outcome Date GA Lbr Len/2nd Weight Sex Delivery Anes PTL Lv  2 Term 1992   7 lb 1.8 oz (3.225 kg) F Vag-Spont   LIV  1 Term 1985   9 lb (4.082 kg) M Vag-Spont   LIV    Past Medical History:  Diagnosis Date  . Abnormal uterine bleeding (AUB)   . Anemia   . H/O vitamin D deficiency   . Menopause   . Uterine leiomyoma     Past Surgical History:  Procedure Laterality Date  . ENDOMETRIAL BIOPSY  08/13/2014   weakly proliferative endometrium- no hyperplasia or carcinoma  . TONSILLECTOMY  1960s    Current Outpatient Medications on File Prior to Visit  Medication Sig Dispense Refill  . amoxicillin (AMOXIL) 875 MG tablet Take 1 tablet (875 mg total) by mouth 2 (two) times daily. (Patient not taking: Reported on 06/12/2017) 20 tablet 0  . fluticasone (FLONASE) 50 MCG/ACT nasal spray Place 2 sprays into both nostrils daily. 16 g 0  . predniSONE (DELTASONE) 20 MG tablet Take 3 pills daily for 2 days, then 2 daily for 2 days, then 1 daily for 2 days for eustachian tube dysfunction 12 tablet 0   No current facility-administered medications on file prior to visit.     No Known Allergies  Social History   Socioeconomic  History  . Marital status: Married    Spouse name: Not on file  . Number of children: 2  . Years of education: 106  . Highest education level: Not on file  Occupational History  . 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 Financial resource strain: Not on file  . Food insecurity:    Worry: Not on file    Inability: Not on file  . Transportation needs:    Medical: Not on file    Non-medical: Not on file  Tobacco Use  . Smoking status: Former Smoker    Years: 10.00    Last attempt to quit: 05/08/1992    Years since quitting: 25.7  . Smokeless tobacco: Never Used  Substance and Sexual Activity  . Alcohol use: Yes    Comment: rare  . Drug use: No  . Sexual activity: Not Currently  Lifestyle  . Physical activity:    Days per week: Not on file    Minutes per session: Not on file  . Stress: Not on file  Relationships  . Social connections:    Talks on phone: Not on file    Gets together: Not on file  Attends religious service: Not on file    Active member of club or organization: Not on file    Attends meetings of clubs or organizations: Not on file    Relationship status: Not on file  . Intimate partner violence:    Fear of current or ex partner: Not on file    Emotionally abused: Not on file    Physically abused: Not on file    Forced sexual activity: Not on file  Other Topics Concern  . Not on file  Social History Narrative   Shristi was born and reared in [place]. She graduated from [employer]now [employer]) in 1982 with a Business degree. 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 She lives at home with her daughter. [place] has been separated for approximately 10 years. She enjoys spending time with friends. She enjoys sports - baseball, football.    Family History  Problem Relation Age of Onset  . Hypertension Mother   . Diabetes Mother   . Heart  disease Mother   . Hypertension Father   . Diabetes Father   . Pancreatic cancer Father   . Hemochromatosis Brother   . Cancer Neg Hx   . Breast cancer Neg Hx     The following portions of the patient's history were reviewed and updated as appropriate: allergies, current medications, past family history, past medical history, past social history, past surgical history and problem list.  Review of Systems Review of Systems  Constitutional:       Vasomotor symptoms moderate, not desiring treatment  HENT: Positive for congestion and ear pain.   Eyes: Negative.   Respiratory: Negative.   Cardiovascular: Negative.   Gastrointestinal: Negative.   Genitourinary: Negative.   Musculoskeletal: Negative.   Skin: Negative.   Neurological: Negative.   Endo/Heme/Allergies: Negative.   Psychiatric/Behavioral: Negative.       Objective:   BP 137/82   Pulse 80   Ht 5\' 7"  (1.702 m)   Wt 155 lb 4.8 oz (70.4 kg)   LMP 02/01/2017 Comment: spotting only  BMI 24.32 kg/m  CONSTITUTIONAL: Well-developed, well-nourished female in no acute distress.  PSYCHIATRIC: Normal mood and affect. Normal behavior. Normal judgment and thought content. Armstrong: Alert and oriented to person, place, and time. Normal muscle tone coordination. No cranial nerve deficit noted. HENT:  Normocephalic, atraumatic, External right and left ear normal.  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  NECK: Normal range of motion, supple, no masses.  Normal thyroid.  SKIN: Skin is warm and dry. No rash noted. Not diaphoretic. No erythema. No pallor. CARDIOVASCULAR: Normal heart rate noted, regular rhythm, no murmur. RESPIRATORY: Clear to auscultation bilaterally. Effort and breath sounds normal, no problems with respiration noted. BREASTS: Symmetric in size. No masses, skin changes, nipple drainage, or  lymphadenopathy. ABDOMEN: Soft, normal bowel sounds, no distention noted.  No tenderness, rebound or guarding.  BLADDER: Normal PELVIC:  External Genitalia: Normal  BUS: Normal  Vagina: Moderate vaginal atrophy; no discharge  Cervix: Normal; no lesions; no cervical motion tenderness  Uterus: Mid plane to retroverted, bulky, 10 weeks size, mobile, nontender  Adnexa: Normal; nonpalpable and nontender  RV: External Exam NormaI, No Rectal Masses and Normal Sphincter tone  MUSCULOSKELETAL: Normal range of motion. No tenderness.  No cyanosis, clubbing, or edema.  2+ distal pulses. LYMPHATIC: No Axillary, Supraclavicular, or Inguinal Adenopathy.    Assessment:  Annual gynecologic examination 57 y.o. 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  I have reviewed, updated, and concur with documentation submitted by Joyice Faster, CMA Joyice Faster, CMA  Brayton Mars, MD  Note: This dictation was prepared with Dragon dictation along with smaller phrase technology. Any transcriptional errors that result from this process are unintentional.

## 2018-02-07 ENCOUNTER — Ambulatory Visit (INDEPENDENT_AMBULATORY_CARE_PROVIDER_SITE_OTHER): Payer: Managed Care, Other (non HMO) | Admitting: Obstetrics and Gynecology

## 2018-02-07 ENCOUNTER — Encounter: Payer: Self-pay | Admitting: Obstetrics and Gynecology

## 2018-02-07 VITALS — BP 137/82 | HR 80 | Ht 67.0 in | Wt 155.3 lb

## 2018-02-07 DIAGNOSIS — Z780 Asymptomatic menopausal state: Secondary | ICD-10-CM

## 2018-02-07 DIAGNOSIS — N952 Postmenopausal atrophic vaginitis: Secondary | ICD-10-CM | POA: Insufficient documentation

## 2018-02-07 DIAGNOSIS — Z1211 Encounter for screening for malignant neoplasm of colon: Secondary | ICD-10-CM | POA: Diagnosis not present

## 2018-02-07 DIAGNOSIS — Z01419 Encounter for gynecological examination (general) (routine) without abnormal findings: Secondary | ICD-10-CM

## 2018-02-07 DIAGNOSIS — Z1231 Encounter for screening mammogram for malignant neoplasm of breast: Secondary | ICD-10-CM

## 2018-02-07 DIAGNOSIS — Z862 Personal history of diseases of the blood and blood-forming organs and certain disorders involving the immune mechanism: Secondary | ICD-10-CM

## 2018-02-07 NOTE — Patient Instructions (Signed)
1.  Pap smear is not done.  Next Pap smear is due 2020. 2.  Mammogram is ordered. 3.  Stool guaiac card testing for colon cancer screening is ordered. 4.  Screening labs are obtained. 5.  Continue with healthy eating, exercise, and control weight loss. 6.  Recommend calcium 600 mg twice a day and vitamin D 400 international units twice a day. 7.  Return in 1 year for annual exam-with Dr. Amalia Hailey.   Health Maintenance for Postmenopausal Women Menopause is a normal process in which your reproductive ability comes to an end. This process happens gradually over a span of months to years, usually between the ages of 78 and 71. Menopause is complete when you have missed 12 consecutive menstrual periods. It is important to talk with your health care provider about some of the most common conditions that affect postmenopausal women, such as heart disease, cancer, and bone loss (osteoporosis). Adopting a healthy lifestyle and getting preventive care can help to promote your health and wellness. Those actions can also lower your chances of developing some of these common conditions. What should I know about menopause? During menopause, you may experience a number of symptoms, such as:  Moderate-to-severe hot flashes.  Night sweats.  Decrease in sex drive.  Mood swings.  Headaches.  Tiredness.  Irritability.  Memory problems.  Insomnia.  Choosing to treat or not to treat menopausal changes is an individual decision that you make with your health care provider. What should I know about hormone replacement therapy and supplements? Hormone therapy products are effective for treating symptoms that are associated with menopause, such as hot flashes and night sweats. Hormone replacement carries certain risks, especially as you become older. If you are thinking about using estrogen or estrogen with progestin treatments, discuss the benefits and risks with your health care provider. What should I know  about heart disease and stroke? Heart disease, heart attack, and stroke become more likely as you age. This may be due, in part, to the hormonal changes that your body experiences during menopause. These can affect how your body processes dietary fats, triglycerides, and cholesterol. Heart attack and stroke are both medical emergencies. There are many things that you can do to help prevent heart disease and stroke:  Have your blood pressure checked at least every 1-2 years. High blood pressure causes heart disease and increases the risk of stroke.  If you are 21-78 years old, ask your health care provider if you should take aspirin to prevent a heart attack or a stroke.  Do not use any tobacco products, including cigarettes, chewing tobacco, or electronic cigarettes. If you need help quitting, ask your health care provider.  It is important to eat a healthy diet and maintain a healthy weight. ? Be sure to include plenty of vegetables, fruits, low-fat dairy products, and lean protein. ? Avoid eating foods that are high in solid fats, added sugars, or salt (sodium).  Get regular exercise. This is one of the most important things that you can do for your health. ? Try to exercise for at least 150 minutes each week. The type of exercise that you do should increase your heart rate and make you sweat. This is known as moderate-intensity exercise. ? Try to do strengthening exercises at least twice each week. Do these in addition to the moderate-intensity exercise.  Know your numbers.Ask your health care provider to check your cholesterol and your blood glucose. Continue to have your blood tested as directed by  your health care provider.  What should I know about cancer screening? There are several types of cancer. Take the following steps to reduce your risk and to catch any cancer development as early as possible. Breast Cancer  Practice breast self-awareness. ? This means understanding how your  breasts normally appear and feel. ? It also means doing regular breast self-exams. Let your health care provider know about any changes, no matter how small.  If you are 5 or older, have a clinician do a breast exam (clinical breast exam or CBE) every year. Depending on your age, family history, and medical history, it may be recommended that you also have a yearly breast X-ray (mammogram).  If you have a family history of breast cancer, talk with your health care provider about genetic screening.  If you are at high risk for breast cancer, talk with your health care provider about having an MRI and a mammogram every year.  Breast cancer (BRCA) gene test is recommended for women who have family members with BRCA-related cancers. Results of the assessment will determine the need for genetic counseling and BRCA1 and for BRCA2 testing. BRCA-related cancers include these types: ? Breast. This occurs in males or females. ? Ovarian. ? Tubal. This may also be called fallopian tube cancer. ? Cancer of the abdominal or pelvic lining (peritoneal cancer). ? Prostate. ? Pancreatic.  Cervical, Uterine, and Ovarian Cancer Your health care provider may recommend that you be screened regularly for cancer of the pelvic organs. These include your ovaries, uterus, and vagina. This screening involves a pelvic exam, which includes checking for microscopic changes to the surface of your cervix (Pap test).  For women ages 21-65, health care providers may recommend a pelvic exam and a Pap test every three years. For women ages 48-65, they may recommend the Pap test and pelvic exam, combined with testing for human papilloma virus (HPV), every five years. Some types of HPV increase your risk of cervical cancer. Testing for HPV may also be done on women of any age who have unclear Pap test results.  Other health care providers may not recommend any screening for nonpregnant women who are considered low risk for pelvic  cancer and have no symptoms. Ask your health care provider if a screening pelvic exam is right for you.  If you have had past treatment for cervical cancer or a condition that could lead to cancer, you need Pap tests and screening for cancer for at least 20 years after your treatment. If Pap tests have been discontinued for you, your risk factors (such as having a new sexual partner) need to be reassessed to determine if you should start having screenings again. Some women have medical problems that increase the chance of getting cervical cancer. In these cases, your health care provider may recommend that you have screening and Pap tests more often.  If you have a family history of uterine cancer or ovarian cancer, talk with your health care provider about genetic screening.  If you have vaginal bleeding after reaching menopause, tell your health care provider.  There are currently no reliable tests available to screen for ovarian cancer.  Lung Cancer Lung cancer screening is recommended for adults 50-66 years old who are at high risk for lung cancer because of a history of smoking. A yearly low-dose CT scan of the lungs is recommended if you:  Currently smoke.  Have a history of at least 30 pack-years of smoking and you currently smoke or  have quit within the past 15 years. A pack-year is smoking an average of one pack of cigarettes per day for one year.  Yearly screening should:  Continue until it has been 15 years since you quit.  Stop if you develop a health problem that would prevent you from having lung cancer treatment.  Colorectal Cancer  This type of cancer can be detected and can often be prevented.  Routine colorectal cancer screening usually begins at age 98 and continues through age 68.  If you have risk factors for colon cancer, your health care provider may recommend that you be screened at an earlier age.  If you have a family history of colorectal cancer, talk with  your health care provider about genetic screening.  Your health care provider may also recommend using home test kits to check for hidden blood in your stool.  A small camera at the end of a tube can be used to examine your colon directly (sigmoidoscopy or colonoscopy). This is done to check for the earliest forms of colorectal cancer.  Direct examination of the colon should be repeated every 5-10 years until age 1. However, if early forms of precancerous polyps or small growths are found or if you have a family history or genetic risk for colorectal cancer, you may need to be screened more often.  Skin Cancer  Check your skin from head to toe regularly.  Monitor any moles. Be sure to tell your health care provider: ? About any new moles or changes in moles, especially if there is a change in a mole's shape or color. ? If you have a mole that is larger than the size of a pencil eraser.  If any of your family members has a history of skin cancer, especially at a young age, talk with your health care provider about genetic screening.  Always use sunscreen. Apply sunscreen liberally and repeatedly throughout the day.  Whenever you are outside, protect yourself by wearing long sleeves, pants, a wide-brimmed hat, and sunglasses.  What should I know about osteoporosis? Osteoporosis is a condition in which bone destruction happens more quickly than new bone creation. After menopause, you may be at an increased risk for osteoporosis. To help prevent osteoporosis or the bone fractures that can happen because of osteoporosis, the following is recommended:  If you are 74-70 years old, get at least 1,000 mg of calcium and at least 600 mg of vitamin D per day.  If you are older than age 77 but younger than age 28, get at least 1,200 mg of calcium and at least 600 mg of vitamin D per day.  If you are older than age 71, get at least 1,200 mg of calcium and at least 800 mg of vitamin D per  day.  Smoking and excessive alcohol intake increase the risk of osteoporosis. Eat foods that are rich in calcium and vitamin D, and do weight-bearing exercises several times each week as directed by your health care provider. What should I know about how menopause affects my mental health? Depression may occur at any age, but it is more common as you become older. Common symptoms of depression include:  Low or sad mood.  Changes in sleep patterns.  Changes in appetite or eating patterns.  Feeling an overall lack of motivation or enjoyment of activities that you previously enjoyed.  Frequent crying spells.  Talk with your health care provider if you think that you are experiencing depression. What should I know  about immunizations? It is important that you get and maintain your immunizations. These include:  Tetanus, diphtheria, and pertussis (Tdap) booster vaccine.  Influenza every year before the flu season begins.  Pneumonia vaccine.  Shingles vaccine.  Your health care provider may also recommend other immunizations. This information is not intended to replace advice given to you by your health care provider. Make sure you discuss any questions you have with your health care provider. Document Released: 06/16/2005 Document Revised: 11/12/2015 Document Reviewed: 01/26/2015 Elsevier Interactive Patient Education  2018 [employer].

## 2018-02-08 ENCOUNTER — Other Ambulatory Visit: Payer: Self-pay

## 2018-02-08 DIAGNOSIS — Z01419 Encounter for gynecological examination (general) (routine) without abnormal findings: Secondary | ICD-10-CM

## 2018-02-09 LAB — TSH: TSH: 1.73 u[IU]/mL (ref 0.450–4.500)

## 2018-02-09 LAB — GLUCOSE, RANDOM: Glucose: 83 mg/dL (ref 65–99)

## 2018-02-09 LAB — LIPID PANEL
CHOL/HDL RATIO: 3.8 ratio (ref 0.0–4.4)
Cholesterol, Total: 229 mg/dL — ABNORMAL HIGH (ref 100–199)
HDL: 60 mg/dL (ref >39)
LDL Calculated: 152 mg/dL — ABNORMAL HIGH (ref 0–99)
Triglycerides: 84 mg/dL (ref 0–149)
VLDL CHOLESTEROL CAL: 17 mg/dL (ref 5–40)

## 2018-02-09 LAB — HEMOGLOBIN A1C
Est. average glucose Bld gHb Est-mCnc: 108 mg/dL
Hgb A1c MFr Bld: 5.4 % (ref 4.8–5.6)

## 2018-02-09 LAB — EPI PROCOLON(R), SEPTIN 9

## 2018-02-12 ENCOUNTER — Other Ambulatory Visit: Payer: Self-pay

## 2018-02-12 DIAGNOSIS — Z1211 Encounter for screening for malignant neoplasm of colon: Secondary | ICD-10-CM

## 2018-02-19 ENCOUNTER — Other Ambulatory Visit: Payer: Managed Care, Other (non HMO)

## 2018-02-19 DIAGNOSIS — Z1211 Encounter for screening for malignant neoplasm of colon: Secondary | ICD-10-CM

## 2018-02-25 LAB — EPI PROCOLON(R), SEPTIN 9

## 2018-04-19 ENCOUNTER — Ambulatory Visit
Admission: RE | Admit: 2018-04-19 | Discharge: 2018-04-19 | Disposition: A | Discharge status: Home / Self Care | Payer: Managed Care, Other (non HMO) | Source: Ambulatory Visit | Attending: Obstetrics and Gynecology | Admitting: Obstetrics and Gynecology

## 2018-04-19 DIAGNOSIS — Z01419 Encounter for gynecological examination (general) (routine) without abnormal findings: Secondary | ICD-10-CM | POA: Insufficient documentation

## 2019-02-10 ENCOUNTER — Encounter: Payer: Managed Care, Other (non HMO) | Admitting: Obstetrics and Gynecology

## 2019-02-11 ENCOUNTER — Encounter: Payer: Self-pay | Admitting: Obstetrics and Gynecology

## 2019-02-11 ENCOUNTER — Other Ambulatory Visit (HOSPITAL_COMMUNITY)
Admission: RE | Admit: 2019-02-11 | Discharge: 2019-02-11 | Disposition: A | Discharge status: Home / Self Care | Payer: Managed Care, Other (non HMO) | Source: Ambulatory Visit | Attending: Obstetrics and Gynecology | Admitting: Obstetrics and Gynecology

## 2019-02-11 ENCOUNTER — Ambulatory Visit (INDEPENDENT_AMBULATORY_CARE_PROVIDER_SITE_OTHER): Payer: Managed Care, Other (non HMO) | Admitting: Obstetrics and Gynecology

## 2019-02-11 ENCOUNTER — Other Ambulatory Visit: Payer: Self-pay

## 2019-02-11 VITALS — BP 160/117 | HR 117 | Ht 67.0 in | Wt 152.3 lb

## 2019-02-11 DIAGNOSIS — Z124 Encounter for screening for malignant neoplasm of cervix: Secondary | ICD-10-CM

## 2019-02-11 DIAGNOSIS — Z1231 Encounter for screening mammogram for malignant neoplasm of breast: Secondary | ICD-10-CM

## 2019-02-11 DIAGNOSIS — I10 Essential (primary) hypertension: Secondary | ICD-10-CM | POA: Diagnosis not present

## 2019-02-11 DIAGNOSIS — Z01419 Encounter for gynecological examination (general) (routine) without abnormal findings: Secondary | ICD-10-CM | POA: Diagnosis not present

## 2019-02-11 MED ORDER — HYDROCHLOROTHIAZIDE 12.5 MG PO TABS
12.5000 mg | ORAL_TABLET | Freq: Two times a day (BID) | ORAL | 1 refills | Status: DC
Start: 2019-02-11 — End: 2019-04-09

## 2019-02-11 NOTE — Progress Notes (Signed)
Patient comes in for yearly exam. She is due for PAP, labs, and mammogram. She is having some lower back pain.

## 2019-02-11 NOTE — Addendum Note (Signed)
Addended by: Durwin Glaze on: 02/11/2019 08:53 AM   Modules accepted: Orders

## 2019-02-11 NOTE — Progress Notes (Signed)
HPI:      [name] is a 58 y.o. [id number] who LMP was Patient's last menstrual period was 02/01/2017.  Subjective:   She presents today for her annual examination.  She slipped and pulled a muscle in her back the other day but it is improving with ibuprofen and rest. She denies vaginal bleeding or menopausal issues. Reports a family history both mother and father having hypertension.    Hx: The following portions of the patient's history were reviewed and updated as appropriate:             She  has a past medical history of Abnormal uterine bleeding (AUB), Anemia, H/O vitamin D deficiency, Menopause, and Uterine leiomyoma. She does not have any pertinent problems on file. She  has a past surgical history that includes Tonsillectomy (1960s) and Endometrial biopsy (08/13/2014). Her family history includes Diabetes in her father and mother; Heart disease in her mother; Hemochromatosis in her brother; Hypertension in her father and mother; Pancreatic cancer in her father. She  reports that she quit smoking about 26 years ago. She quit after 10.00 years of use. She has never used smokeless tobacco. She reports current alcohol use. She reports that she does not use drugs. She has a current medication list which includes the following prescription(s): hydrochlorothiazide. She has No Known Allergies.       Review of Systems:  Review of Systems  Constitutional: Denied constitutional symptoms, night sweats, recent illness, fatigue, fever, insomnia and weight loss.  Eyes: Denied eye symptoms, eye pain, photophobia, vision change and visual disturbance.  Ears/Nose/Throat/Neck: Denied ear, nose, throat or neck symptoms, hearing loss, nasal discharge, sinus congestion and sore throat.  Cardiovascular: Denied cardiovascular symptoms, arrhythmia, chest pain/pressure, edema, exercise intolerance, orthopnea and palpitations.  Respiratory: Denied pulmonary symptoms, asthma, pleuritic pain, productive  sputum, cough, dyspnea and wheezing.  Gastrointestinal: Denied, gastro-esophageal reflux, melena, nausea and vomiting.  Genitourinary: Denied genitourinary symptoms including symptomatic vaginal discharge, pelvic relaxation issues, and urinary complaints.  Musculoskeletal: Denied musculoskeletal symptoms, stiffness, swelling, muscle weakness and myalgia.  Dermatologic: Denied dermatology symptoms, rash and scar.  Neurologic: Denied neurology symptoms, dizziness, headache, neck pain and syncope.  Psychiatric: Denied psychiatric symptoms, anxiety and depression.  Endocrine: Denied endocrine symptoms including hot flashes and night sweats.   Meds:   No current outpatient medications on file prior to visit.   No current facility-administered medications on file prior to visit.     Objective:     Vitals:   02/11/19 0806  BP: (!) 160/117  Pulse: (!) 117              Physical examination General NAD, Conversant  HEENT Atraumatic; Op clear with mmm.  Normo-cephalic. Pupils reactive. Anicteric sclerae  Thyroid/Neck Smooth without nodularity or enlargement. Normal ROM.  Neck Supple.  Skin No rashes, lesions or ulceration. Normal palpated skin turgor. No nodularity.  Breasts: No masses or discharge.  Symmetric.  No axillary adenopathy.  Lungs: Clear to auscultation.No rales or wheezes. Normal Respiratory effort, no retractions.  Heart: NSR.  No murmurs or rubs appreciated. No periferal edema  Abdomen: Soft.  Non-tender.  No masses.  No HSM. No hernia  Extremities: Moves all appropriately.  Normal ROM for age. No lymphadenopathy.  Neuro: Oriented to PPT.  Normal mood. Normal affect.     Pelvic:   Vulva: Normal appearance.  No lesions.  Vagina: No lesions or abnormalities noted.  Significant vaginal atrophy  Support: Normal pelvic support.  Urethra No masses  tenderness or scarring.  Meatus Normal size without lesions or prolapse.  Cervix: Normal appearance.  No lesions.  Significant  atrophy  Anus: Normal exam.  No lesions.  Perineum: Normal exam.  No lesions.        Bimanual   Uterus: Normal size.  Non-tender.  Mobile.  AV.  Adnexae: No masses.  Non-tender to palpation.  Cul-de-sac: Negative for abnormality.      Assessment:    [id number] Patient Active Problem List   Diagnosis Date Noted  . Vaginal atrophy 02/07/2018  . History of anemia 02/06/2017  . Menopause 02/06/2017  . Uterine leiomyoma 06/29/2015  . Postmenopausal bleeding 06/14/2014  . Abdominal pain, chronic, right lower quadrant 06/14/2014  . Encounter for routine gynecological examination 04/08/2013  . Screening for cervical cancer 04/08/2013  . Routine general medical examination at a health care facility 12/17/2012  . Sleep disturbance 12/17/2012  . Constipation 12/17/2012     1. Well woman exam with routine gynecological exam   2. Encounter for screening mammogram for breast cancer   3. Essential hypertension     Patient with significant hypertension present.  She also has a family history of hypertension so she is "not surprised".   Plan:            1.  Basic Screening Recommendations The basic screening recommendations for asymptomatic women were discussed with the patient during her visit.  The age-appropriate recommendations were discussed with her and the rational for the tests reviewed.  When I am informed by the patient that another primary care physician has previously obtained the age-appropriate tests and they are up-to-date, only outstanding tests are ordered and referrals given as necessary.  Abnormal results of tests will be discussed with her when all of her results are completed. Pap performed-mammogram ordered- labs drawn 2.  We will begin treatment for hypertension using hydrochlorothiazide.  Strongly advised patient to seek long-term care with internist, family practice or nurse practitioner etc.  Orders Orders Placed This Encounter  Procedures  . MM 3D SCREEN BREAST  BILATERAL  . Hemoglobin A1c  . Lipid panel  . TSH     Meds ordered this encounter  Medications  . hydrochlorothiazide (HYDRODIURIL) 12.5 MG tablet    Sig: Take 1 tablet (12.5 mg total) by mouth 2 (two) times daily.    Dispense:  60 tablet    Refill:  1        F/U  No follow-ups on file.  Finis Bud, M.D. 02/11/2019 8:43 AM

## 2019-02-12 LAB — TSH: TSH: 3.28 u[IU]/mL (ref 0.450–4.500)

## 2019-02-12 LAB — HEMOGLOBIN A1C
Est. average glucose Bld gHb Est-mCnc: 108 mg/dL
Hgb A1c MFr Bld: 5.4 % (ref 4.8–5.6)

## 2019-02-12 LAB — LIPID PANEL
Chol/HDL Ratio: 3.5 ratio (ref 0.0–4.4)
Cholesterol, Total: 227 mg/dL — ABNORMAL HIGH (ref 100–199)
HDL: 65 mg/dL (ref >39)
LDL Chol Calc (NIH): 145 mg/dL — ABNORMAL HIGH (ref 0–99)
Triglycerides: 95 mg/dL (ref 0–149)
VLDL Cholesterol Cal: 17 mg/dL (ref 5–40)

## 2019-02-19 LAB — CYTOLOGY - PAP
Comment: NEGATIVE
Diagnosis: NEGATIVE
High risk HPV: NEGATIVE

## 2019-04-09 ENCOUNTER — Other Ambulatory Visit: Payer: Self-pay | Admitting: Obstetrics and Gynecology

## 2019-04-09 DIAGNOSIS — I10 Essential (primary) hypertension: Secondary | ICD-10-CM

## 2019-04-22 ENCOUNTER — Ambulatory Visit
Admission: RE | Admit: 2019-04-22 | Discharge: 2019-04-22 | Disposition: A | Discharge status: Home / Self Care | Payer: Managed Care, Other (non HMO) | Source: Ambulatory Visit | Attending: Obstetrics and Gynecology | Admitting: Obstetrics and Gynecology

## 2019-04-22 DIAGNOSIS — Z01419 Encounter for gynecological examination (general) (routine) without abnormal findings: Secondary | ICD-10-CM

## 2019-04-22 DIAGNOSIS — Z1231 Encounter for screening mammogram for malignant neoplasm of breast: Secondary | ICD-10-CM | POA: Insufficient documentation

## 2019-06-15 ENCOUNTER — Other Ambulatory Visit: Payer: Self-pay | Admitting: Obstetrics and Gynecology

## 2019-06-15 DIAGNOSIS — I10 Essential (primary) hypertension: Secondary | ICD-10-CM

## 2020-02-12 ENCOUNTER — Encounter: Payer: Self-pay | Admitting: Obstetrics and Gynecology

## 2020-02-12 ENCOUNTER — Other Ambulatory Visit: Payer: Self-pay

## 2020-02-12 ENCOUNTER — Ambulatory Visit (INDEPENDENT_AMBULATORY_CARE_PROVIDER_SITE_OTHER): Payer: Managed Care, Other (non HMO) | Admitting: Obstetrics and Gynecology

## 2020-02-12 VITALS — BP 125/71 | HR 99 | Ht 67.0 in | Wt 164.5 lb

## 2020-02-12 DIAGNOSIS — Z01419 Encounter for gynecological examination (general) (routine) without abnormal findings: Secondary | ICD-10-CM

## 2020-02-12 DIAGNOSIS — Z1231 Encounter for screening mammogram for malignant neoplasm of breast: Secondary | ICD-10-CM | POA: Diagnosis not present

## 2020-02-12 NOTE — Progress Notes (Signed)
HPI:      [name] is a 59 y.o. [id number] who LMP was Patient's last menstrual period was 02/01/2017.  Subjective:   She presents today for her annual examination.  She has no complaints.  She feels well today and has been doing well in general.  She continues to take hydrochlorothiazide for blood pressure and has remained controlled.  She did note that her cholesterol was elevated last year and says that she has been "occasionally watching her diet". She has not yet received the Covid vaccine.      Hx: The following portions of the patient's history were reviewed and updated as appropriate:             She  has a past medical history of Abnormal uterine bleeding (AUB), Anemia, H/O vitamin D deficiency, Menopause, and Uterine leiomyoma. She does not have any pertinent problems on file. She  has a past surgical history that includes Tonsillectomy (1960s) and Endometrial biopsy (08/13/2014). Her family history includes Diabetes in her father and mother; Heart disease in her mother; Hemochromatosis in her brother; Hypertension in her father and mother; Pancreatic cancer in her father. She  reports that she quit smoking about 27 years ago. She quit after 10.00 years of use. She has never used smokeless tobacco. She reports current alcohol use. She reports that she does not use drugs. She has a current medication list which includes the following prescription(s): hydrochlorothiazide. She has No Known Allergies.       Review of Systems:  Review of Systems  Constitutional: Denied constitutional symptoms, night sweats, recent illness, fatigue, fever, insomnia and weight loss.  Eyes: Denied eye symptoms, eye pain, photophobia, vision change and visual disturbance.  Ears/Nose/Throat/Neck: Denied ear, nose, throat or neck symptoms, hearing loss, nasal discharge, sinus congestion and sore throat.  Cardiovascular: Denied cardiovascular symptoms, arrhythmia, chest pain/pressure, edema, exercise  intolerance, orthopnea and palpitations.  Respiratory: Denied pulmonary symptoms, asthma, pleuritic pain, productive sputum, cough, dyspnea and wheezing.  Gastrointestinal: Denied, gastro-esophageal reflux, melena, nausea and vomiting.  Genitourinary: Denied genitourinary symptoms including symptomatic vaginal discharge, pelvic relaxation issues, and urinary complaints.  Musculoskeletal: Denied musculoskeletal symptoms, stiffness, swelling, muscle weakness and myalgia.  Dermatologic: Denied dermatology symptoms, rash and scar.  Neurologic: Denied neurology symptoms, dizziness, headache, neck pain and syncope.  Psychiatric: Denied psychiatric symptoms, anxiety and depression.  Endocrine: Denied endocrine symptoms including hot flashes and night sweats.   Meds:   Current Outpatient Medications on File Prior to Visit  Medication Sig Dispense Refill  . hydrochlorothiazide (HYDRODIURIL) 12.5 MG tablet TAKE 1 TABLET(12.5 MG) BY MOUTH TWICE DAILY 60 tablet 1   No current facility-administered medications on file prior to visit.          Objective:     Vitals:   02/12/20 0807  BP: 125/71  Pulse: 99    Filed Weights   02/12/20 0807  Weight: 164 lb 8 oz (74.6 kg)              Physical examination General NAD, Conversant  HEENT Atraumatic; Op clear with mmm.  Normo-cephalic. Pupils reactive. Anicteric sclerae  Thyroid/Neck Smooth without nodularity or enlargement. Normal ROM.  Neck Supple.  Skin No rashes, lesions or ulceration. Normal palpated skin turgor. No nodularity.  Breasts: No masses or discharge.  Symmetric.  No axillary adenopathy.  Lungs: Clear to auscultation.No rales or wheezes. Normal Respiratory effort, no retractions.  Heart: NSR.  No murmurs or rubs appreciated. No periferal edema  Abdomen: Soft.  Non-tender.  No masses.  No HSM. No hernia  Extremities: Moves all appropriately.  Normal ROM for age. No lymphadenopathy.  Neuro: Oriented to PPT.  Normal mood. Normal  affect.     Pelvic:   Vulva: Normal appearance.  No lesions.  Vagina: No lesions or abnormalities noted.  Atrophic vaginitis  Support: Normal pelvic support.  Urethra No masses tenderness or scarring.  Meatus Normal size without lesions or prolapse.  Cervix: Normal appearance.  No lesions.  Anus: Normal exam.  No lesions.  Perineum: Normal exam.  No lesions.        Bimanual   Uterus: Normal size.  Non-tender.  Mobile.  AV.  Adnexae: No masses.  Non-tender to palpation.  Cul-de-sac: Negative for abnormality.      Assessment:    [id number] Patient Active Problem List   Diagnosis Date Noted  . Vaginal atrophy 02/07/2018  . History of anemia 02/06/2017  . Menopause 02/06/2017  . Uterine leiomyoma 06/29/2015  . Postmenopausal bleeding 06/14/2014  . Abdominal pain, chronic, right lower quadrant 06/14/2014  . Encounter for routine gynecological examination 04/08/2013  . Screening for cervical cancer 04/08/2013  . Routine general medical examination at a health care facility 12/17/2012  . Sleep disturbance 12/17/2012  . Constipation 12/17/2012     1. Well woman exam with routine gynecological exam   2. Encounter for screening mammogram for breast cancer        Plan:            1.  Basic Screening Recommendations The basic screening recommendations for asymptomatic women were discussed with the patient during her visit.  The age-appropriate recommendations were discussed with her and the rational for the tests reviewed.  When I am informed by the patient that another primary care physician has previously obtained the age-appropriate tests and they are up-to-date, only outstanding tests are ordered and referrals given as necessary.  Abnormal results of tests will be discussed with her when all of her results are completed.  Routine preventative health maintenance measures emphasized: Exercise/Diet/Weight control, Tobacco Warnings, Alcohol/Substance use risks and Stress  Management Mammogram ordered -blood work through PCP 2.  Continue hydrochlorothiazide for hypertension. 3.  Recommend Covid vaccination. Orders Orders Placed This Encounter  Procedures  . MM 3D SCREEN BREAST BILATERAL    No orders of the defined types were placed in this encounter.           F/U  No follow-ups on file.  Finis Bud, M.D. 02/12/2020 8:31 AM

## 2020-03-25 ENCOUNTER — Ambulatory Visit: Payer: Self-pay

## 2020-04-14 ENCOUNTER — Ambulatory Visit (INDEPENDENT_AMBULATORY_CARE_PROVIDER_SITE_OTHER): Payer: Managed Care, Other (non HMO) | Admitting: Podiatry

## 2020-04-14 ENCOUNTER — Other Ambulatory Visit: Payer: Self-pay

## 2020-04-14 ENCOUNTER — Ambulatory Visit (INDEPENDENT_AMBULATORY_CARE_PROVIDER_SITE_OTHER): Payer: Managed Care, Other (non HMO)

## 2020-04-14 ENCOUNTER — Other Ambulatory Visit: Payer: Self-pay | Admitting: Podiatry

## 2020-04-14 ENCOUNTER — Encounter: Payer: Self-pay | Admitting: Podiatry

## 2020-04-14 DIAGNOSIS — M2042 Other hammer toe(s) (acquired), left foot: Secondary | ICD-10-CM

## 2020-04-14 DIAGNOSIS — G5782 Other specified mononeuropathies of left lower limb: Secondary | ICD-10-CM

## 2020-04-14 DIAGNOSIS — G5762 Lesion of plantar nerve, left lower limb: Secondary | ICD-10-CM

## 2020-04-14 MED ORDER — TRIAMCINOLONE ACETONIDE 40 MG/ML IJ SUSP
20.0000 mg | Freq: Once | INTRAMUSCULAR | Status: AC
  Administered 2020-04-14: 20 mg

## 2020-04-14 NOTE — Progress Notes (Signed)
  Subjective:  Patient ID: Caitlyn [name], female    DOB: [birth year],  MRN: [id number] HPI Chief Complaint  Patient presents with  . Toe Pain    4th toe/plantar forefoot left - burning, some numbness, aching x 2 years intermittent, tried different shoes, no help  . New Patient (Initial Visit)    59 y.o. female presents with the above complaint.   ROS: Denies fever chills nausea vomiting muscle aches pains calf pain back pain chest pain shortness of breath.  Past Medical History:  Diagnosis Date  . Abnormal uterine bleeding (AUB)   . Anemia   . H/O vitamin D deficiency   . Menopause   . Uterine leiomyoma    Past Surgical History:  Procedure Laterality Date  . ENDOMETRIAL BIOPSY  08/13/2014   weakly proliferative endometrium- no hyperplasia or carcinoma  . TONSILLECTOMY  1960s    Current Outpatient Medications:  .  albuterol (VENTOLIN HFA) 108 (90 Base) MCG/ACT inhaler, SMARTSIG:1-2 Puff(s) By Mouth Every 4-6 Hours PRN, Disp: , Rfl:  .  hydrochlorothiazide (HYDRODIURIL) 12.5 MG tablet, TAKE 1 TABLET(12.5 MG) BY MOUTH TWICE DAILY, Disp: 60 tablet, Rfl: 1  No Known Allergies Review of Systems Objective:  There were no vitals filed for this visit.  General: Well developed, nourished, in no acute distress, alert and oriented x3   Dermatological: Skin is warm, dry and supple bilateral. Nails x 10 are well maintained; remaining integument appears unremarkable at this time. There are no open sores, no preulcerative lesions, no rash or signs of infection present  Vascular: Dorsalis Pedis artery and Posterior Tibial artery pedal pulses are 2/4 bilateral with immedate capillary fill time. Pedal hair growth present. No varicosities and no lower extremity edema present bilateral.   Neruologic: Grossly intact via light touch bilateral. Vibratory intact via tuning fork bilateral. Protective threshold with Semmes Wienstein monofilament intact to all pedal sites bilateral. Patellar and Achilles  deep tendon reflexes 2+ bilateral. No Babinski or clonus noted bilateral.  Reproducible pain with palpable Mulder's click third interspace of the left foot.  Musculoskeletal: No gross boney pedal deformities bilateral. No pain, crepitus, or limitation noted with foot and ankle range of motion bilateral. Muscular strength 5/5 in all groups tested bilateral.  No reproducible pain on palpation of the bones.  Or joints she does have reproducible pain on palpation of the third interdigital space left foot  Gait: Unassisted, Nonantalgic.    Radiographs:  Radiographs taken today do not demonstrate any type of acute osseous abnormalities for the left foot.  Assessment & Plan:   Assessment: Neuroma third interspace left.  Plan: Injected 10 mg of Kenalog into the third interdigital space discussed the possible need for dehydrated alcohol we will follow-up with her in 1 month     Akeel Reffner T. [place], [place]

## 2020-05-21 ENCOUNTER — Ambulatory Visit
Admission: RE | Admit: 2020-05-21 | Discharge: 2020-05-21 | Disposition: A | Discharge status: Home / Self Care | Payer: Managed Care, Other (non HMO) | Source: Ambulatory Visit | Attending: Obstetrics and Gynecology | Admitting: Obstetrics and Gynecology

## 2020-05-21 ENCOUNTER — Other Ambulatory Visit: Payer: Self-pay

## 2020-05-21 DIAGNOSIS — Z1231 Encounter for screening mammogram for malignant neoplasm of breast: Secondary | ICD-10-CM | POA: Diagnosis present

## 2020-05-26 ENCOUNTER — Ambulatory Visit: Payer: Managed Care, Other (non HMO) | Admitting: Podiatry

## 2021-02-15 ENCOUNTER — Other Ambulatory Visit: Payer: Self-pay

## 2021-02-15 ENCOUNTER — Ambulatory Visit (INDEPENDENT_AMBULATORY_CARE_PROVIDER_SITE_OTHER): Payer: Managed Care, Other (non HMO) | Admitting: Obstetrics and Gynecology

## 2021-02-15 ENCOUNTER — Encounter: Payer: Self-pay | Admitting: Obstetrics and Gynecology

## 2021-02-15 VITALS — BP 100/59 | HR 73 | Temp 98.4°F | Resp 16 | Ht 66.5 in | Wt 165.6 lb

## 2021-02-15 DIAGNOSIS — Z01419 Encounter for gynecological examination (general) (routine) without abnormal findings: Secondary | ICD-10-CM | POA: Diagnosis not present

## 2021-02-15 DIAGNOSIS — N952 Postmenopausal atrophic vaginitis: Secondary | ICD-10-CM | POA: Diagnosis not present

## 2021-02-15 NOTE — Progress Notes (Signed)
HPI:      [name] is a 60 y.o. [id number] who LMP was Caitlyn [name]'s last menstrual period was 02/01/2017.  Subjective:   She presents today for her annual examination.  She has no complaints.  In conjunction with her PCP she is changing her diet and now "working on her cholesterol". She is due for mammography later this year.    Hx: The following portions of the Caitlyn [name]'s history were reviewed and updated as appropriate:             She  has a past medical history of Abnormal uterine bleeding (AUB), Anemia, H/O vitamin D deficiency, Menopause, and Uterine leiomyoma. She does not have any pertinent problems on file. She  has a past surgical history that includes Tonsillectomy (1960s) and Endometrial biopsy (08/13/2014). Her family history includes Diabetes in her father and mother; Heart disease in her mother; Hemochromatosis in her brother; Hypertension in her father and mother; Pancreatic cancer in her father. She  reports that she quit smoking about 28 years ago. Her smoking use included cigarettes. She has never used smokeless tobacco. She reports current alcohol use. She reports that she does not use drugs. She has a current medication list which includes the following prescription(s): albuterol and hydrochlorothiazide. She has No Known Allergies.       Review of Systems:  Review of Systems  Constitutional: Denied constitutional symptoms, night sweats, recent illness, fatigue, fever, insomnia and weight loss.  Eyes: Denied eye symptoms, eye pain, photophobia, vision change and visual disturbance.  Ears/Nose/Throat/Neck: Denied ear, nose, throat or neck symptoms, hearing loss, nasal discharge, sinus congestion and sore throat.  Cardiovascular: Denied cardiovascular symptoms, arrhythmia, chest pain/pressure, edema, exercise intolerance, orthopnea and palpitations.  Respiratory: Denied pulmonary symptoms, asthma, pleuritic pain, productive sputum, cough, dyspnea and wheezing.   Gastrointestinal: Denied, gastro-esophageal reflux, melena, nausea and vomiting.  Genitourinary: Denied genitourinary symptoms including symptomatic vaginal discharge, pelvic relaxation issues, and urinary complaints.  Musculoskeletal: Denied musculoskeletal symptoms, stiffness, swelling, muscle weakness and myalgia.  Dermatologic: Denied dermatology symptoms, rash and scar.  Neurologic: Denied neurology symptoms, dizziness, headache, neck pain and syncope.  Psychiatric: Denied psychiatric symptoms, anxiety and depression.  Endocrine: Denied endocrine symptoms including hot flashes and night sweats.   Meds:   Current Outpatient Medications on File Prior to Visit  Medication Sig Dispense Refill   albuterol (VENTOLIN HFA) 108 (90 Base) MCG/ACT inhaler SMARTSIG:1-2 Puff(s) By Mouth Every 4-6 Hours PRN     hydrochlorothiazide (HYDRODIURIL) 12.5 MG tablet TAKE 1 TABLET(12.5 MG) BY MOUTH TWICE DAILY 60 tablet 1   No current facility-administered medications on file prior to visit.       Objective:     Vitals:   02/15/21 0822  BP: (!) 100/59  Pulse: 73  Resp: 16  Temp: 98.4 F (36.9 C)  SpO2: 100%    Filed Weights   02/15/21 0822  Weight: 165 lb 9.6 oz (75.1 kg)              Physical examination General NAD, Conversant  HEENT Atraumatic; Op clear with mmm.  Normo-cephalic. Pupils reactive. Anicteric sclerae  Thyroid/Neck Smooth without nodularity or enlargement. Normal ROM.  Neck Supple.  Skin No rashes, lesions or ulceration. Normal palpated skin turgor. No nodularity.  Breasts: No masses or discharge.  Symmetric.  No axillary adenopathy.  Lungs: Clear to auscultation.No rales or wheezes. Normal Respiratory effort, no retractions.  Heart: NSR.  No murmurs or rubs appreciated. No periferal edema  Abdomen: Soft.  Non-tender.  No masses.  No HSM. No hernia  Extremities: Moves all appropriately.  Normal ROM for age. No lymphadenopathy.  Neuro: Oriented to PPT.  Normal mood.  Normal affect.     Pelvic:   Vulva: Normal appearance.  No lesions.  Vagina: No lesions or abnormalities noted.  Moderate vaginal atrophy  Support: Normal pelvic support.  Urethra No masses tenderness or scarring.  Meatus Normal size without lesions or prolapse.  Cervix: Normal appearance.  No lesions.  Anus: Normal exam.  No lesions.  Perineum: Normal exam.  No lesions.        Bimanual   Uterus: Normal size.  Non-tender.  Mobile.  AV.  Adnexae: No masses.  Non-tender to palpation.  Cul-de-sac: Negative for abnormality.     Assessment:    G2P2002 Caitlyn [name] Active Problem List   Diagnosis Date Noted   Vaginal atrophy 02/07/2018   History of anemia 02/06/2017   Menopause 02/06/2017   Uterine leiomyoma 06/29/2015   Postmenopausal bleeding 06/14/2014   Abdominal pain, chronic, right lower quadrant 06/14/2014   Encounter for routine gynecological examination 04/08/2013   Screening for cervical cancer 04/08/2013   Routine general medical examination at a health care facility 12/17/2012   Sleep disturbance 12/17/2012   Constipation 12/17/2012     1. Well woman exam with routine gynecological exam   2. Vaginal atrophy        Plan:            1.  Basic Screening Recommendations The basic screening recommendations for asymptomatic women were discussed with the Caitlyn [name] during her visit.  The age-appropriate recommendations were discussed with her and the rational for the tests reviewed.  When I am informed by the Caitlyn [name] that another primary care physician has previously obtained the age-appropriate tests and they are up-to-date, only outstanding tests are ordered and referrals given as necessary.  Abnormal results of tests will be discussed with her when all of her results are completed.  Routine preventative health maintenance measures emphasized: Exercise/Diet/Weight control, Tobacco Warnings, Alcohol/Substance use risks and Stress Management Mammogram ordered-Pap next  year. Orders Orders Placed This Encounter  Procedures   MM DIGITAL SCREENING BILATERAL     No orders of the defined types were placed in this encounter.           F/U  Return in about 1 year (around 02/15/2022) for Annual Physical.  Finis Bud, M.D. 02/15/2021 8:45 AM

## 2021-04-06 ENCOUNTER — Other Ambulatory Visit: Payer: Self-pay | Admitting: Obstetrics and Gynecology

## 2021-04-06 DIAGNOSIS — Z1231 Encounter for screening mammogram for malignant neoplasm of breast: Secondary | ICD-10-CM

## 2021-05-24 ENCOUNTER — Ambulatory Visit
Admission: RE | Admit: 2021-05-24 | Discharge: 2021-05-24 | Disposition: A | Discharge status: Home / Self Care | Payer: Managed Care, Other (non HMO) | Source: Ambulatory Visit | Attending: Obstetrics and Gynecology | Admitting: Obstetrics and Gynecology

## 2021-05-24 ENCOUNTER — Other Ambulatory Visit: Payer: Self-pay

## 2021-05-24 DIAGNOSIS — Z1231 Encounter for screening mammogram for malignant neoplasm of breast: Secondary | ICD-10-CM | POA: Diagnosis present

## 2022-02-16 ENCOUNTER — Other Ambulatory Visit (HOSPITAL_COMMUNITY)
Admission: RE | Admit: 2022-02-16 | Discharge: 2022-02-16 | Disposition: A | Discharge status: Home / Self Care | Payer: Managed Care, Other (non HMO) | Source: Ambulatory Visit | Attending: Obstetrics and Gynecology | Admitting: Obstetrics and Gynecology

## 2022-02-16 ENCOUNTER — Ambulatory Visit: Payer: Managed Care, Other (non HMO) | Admitting: Obstetrics and Gynecology

## 2022-02-16 ENCOUNTER — Encounter: Payer: Self-pay | Admitting: Obstetrics and Gynecology

## 2022-02-16 VITALS — BP 123/82 | HR 82 | Ht 66.5 in | Wt 169.3 lb

## 2022-02-16 DIAGNOSIS — Z01419 Encounter for gynecological examination (general) (routine) without abnormal findings: Secondary | ICD-10-CM

## 2022-02-16 DIAGNOSIS — Z124 Encounter for screening for malignant neoplasm of cervix: Secondary | ICD-10-CM | POA: Insufficient documentation

## 2022-02-16 DIAGNOSIS — Z1231 Encounter for screening mammogram for malignant neoplasm of breast: Secondary | ICD-10-CM

## 2022-02-16 NOTE — Progress Notes (Signed)
Patients presents for annual exam today. She states doing well, recently hurt her arm and is dealing with that. Postmenopausal, denies bleeding. Patient is due for pap smear, ordered. Due for mammogram, ordered. Patients annual labs are declined, will be done through work.  Patient states no other questions or concerns at this time.

## 2022-02-16 NOTE — Progress Notes (Signed)
HPI:      [name] is a 61 y.o. [id number] who LMP was Patient's last menstrual period was 02/01/2017.  Subjective:   She presents today for her annual examination.  She has no complaints.  She generally feels well.  She recently was playing soccer with her grandson and fell and hurt her wrist.  She has an appointment later today for follow-up to see if there is any break.    Hx: The following portions of the patient's history were reviewed and updated as appropriate:             She  has a past medical history of Abnormal uterine bleeding (AUB), Anemia, H/O vitamin D deficiency, Menopause, and Uterine leiomyoma. She does not have any pertinent problems on file. She  has a past surgical history that includes Tonsillectomy (1960s) and Endometrial biopsy (08/13/2014). Her family history includes Diabetes in her father and mother; Heart disease in her mother; Hemochromatosis in her brother; Hypertension in her father and mother; Pancreatic cancer in her father. She  reports that she quit smoking about 29 years ago. Her smoking use included cigarettes. She has never used smokeless tobacco. She reports current alcohol use. She reports that she does not use drugs. She has a current medication list which includes the following prescription(s): albuterol and hydrochlorothiazide. She has No Known Allergies.       Review of Systems:  Review of Systems  Constitutional: Denied constitutional symptoms, night sweats, recent illness, fatigue, fever, insomnia and weight loss.  Eyes: Denied eye symptoms, eye pain, photophobia, vision change and visual disturbance.  Ears/Nose/Throat/Neck: Denied ear, nose, throat or neck symptoms, hearing loss, nasal discharge, sinus congestion and sore throat.  Cardiovascular: Denied cardiovascular symptoms, arrhythmia, chest pain/pressure, edema, exercise intolerance, orthopnea and palpitations.  Respiratory: Denied pulmonary symptoms, asthma, pleuritic pain, productive  sputum, cough, dyspnea and wheezing.  Gastrointestinal: Denied, gastro-esophageal reflux, melena, nausea and vomiting.  Genitourinary: Denied genitourinary symptoms including symptomatic vaginal discharge, pelvic relaxation issues, and urinary complaints.  Musculoskeletal: See HPI for additional information.  Dermatologic: Denied dermatology symptoms, rash and scar.  Neurologic: Denied neurology symptoms, dizziness, headache, neck pain and syncope.  Psychiatric: Denied psychiatric symptoms, anxiety and depression.  Endocrine: Denied endocrine symptoms including hot flashes and night sweats.   Meds:   Current Outpatient Medications on File Prior to Visit  Medication Sig Dispense Refill   albuterol (VENTOLIN HFA) 108 (90 Base) MCG/ACT inhaler SMARTSIG:1-2 Puff(s) By Mouth Every 4-6 Hours PRN     hydrochlorothiazide (HYDRODIURIL) 12.5 MG tablet TAKE 1 TABLET(12.5 MG) BY MOUTH TWICE DAILY 60 tablet 1   No current facility-administered medications on file prior to visit.     Objective:     Vitals:   02/16/22 0806  BP: 123/82  Pulse: 82    Filed Weights   02/16/22 0806  Weight: 169 lb 4.8 oz (76.8 kg)              Physical examination General NAD, Conversant  HEENT Atraumatic; Op clear with mmm.  Normo-cephalic. Pupils reactive. Anicteric sclerae  Thyroid/Neck Smooth without nodularity or enlargement. Normal ROM.  Neck Supple.  Skin No rashes, lesions or ulceration. Normal palpated skin turgor. No nodularity.  Breasts: No masses or discharge.  Symmetric.  No axillary adenopathy.  Lungs: Clear to auscultation.No rales or wheezes. Normal Respiratory effort, no retractions.  Heart: NSR.  No murmurs or rubs appreciated. No periferal edema  Abdomen: Soft.  Non-tender.  No masses.  No HSM. No  hernia  Extremities: Moves all appropriately.  Normal ROM for age. No lymphadenopathy.  Neuro: Oriented to PPT.  Normal mood. Normal affect.     Pelvic:   Vulva: Normal appearance.  No lesions.   Vagina: No lesions or abnormalities noted.  Support: Normal pelvic support.  Urethra No masses tenderness or scarring.  Meatus Normal size without lesions or prolapse.  Cervix: Normal appearance.  No lesions.  Anus: Normal exam.  No lesions.  Perineum: Normal exam.  No lesions.        Bimanual   Uterus: Normal size.  Non-tender.  Mobile.  AV.  Adnexae: No masses.  Non-tender to palpation.  Cul-de-sac: Negative for abnormality.     Assessment:    G2P2002 Patient Active Problem List   Diagnosis Date Noted   Vaginal atrophy 02/07/2018   History of anemia 02/06/2017   Menopause 02/06/2017   Uterine leiomyoma 06/29/2015   Postmenopausal bleeding 06/14/2014   Abdominal pain, chronic, right lower quadrant 06/14/2014   Encounter for routine gynecological examination 04/08/2013   Screening for cervical cancer 04/08/2013   Routine general medical examination at a health care facility 12/17/2012   Sleep disturbance 12/17/2012   Constipation 12/17/2012     1. Well woman exam with routine gynecological exam   2. Cervical cancer screening   3. Screening mammogram for breast cancer     Normal exam   Plan:            1.  Basic Screening Recommendations The basic screening recommendations for asymptomatic women were discussed with the patient during her visit.  The age-appropriate recommendations were discussed with her and the rational for the tests reviewed.  When I am informed by the patient that another primary care physician has previously obtained the age-appropriate tests and they are up-to-date, only outstanding tests are ordered and referrals given as necessary.  Abnormal results of tests will be discussed with her when all of her results are completed.  Routine preventative health maintenance measures emphasized: Exercise/Diet/Weight control, Tobacco Warnings, Alcohol/Substance use risks and Stress Management Pap performed-mammogram ordered-patient gets blood work at her  workplace. Orders Orders Placed This Encounter  Procedures   MM DIGITAL SCREENING BILATERAL    No orders of the defined types were placed in this encounter.        F/U  Return in about 1 year (around 02/17/2023) for Annual Physical.  Finis Bud, M.D. 02/16/2022 8:35 AM

## 2022-02-22 LAB — CYTOLOGY - PAP
Comment: NEGATIVE
Diagnosis: NEGATIVE
High risk HPV: NEGATIVE

## 2022-03-13 DIAGNOSIS — S52502A Unspecified fracture of the lower end of left radius, initial encounter for closed fracture: Secondary | ICD-10-CM | POA: Insufficient documentation

## 2022-03-28 IMAGING — MG MM DIGITAL SCREENING BILAT W/ TOMO AND CAD
8 series · 8 of 24 positions shown · non-contrast
Comparison: Previous exam(s).

CLINICAL DATA: Screening.

EXAM:
DIGITAL SCREENING BILATERAL MAMMOGRAM WITH TOMOSYNTHESIS AND CAD
TECHNIQUE: Bilateral screening digital craniocaudal and mediolateral oblique
mammograms were obtained. Bilateral screening digital breast
tomosynthesis was performed. The images were evaluated with
computer-aided detection.

[L CC synth-2D]
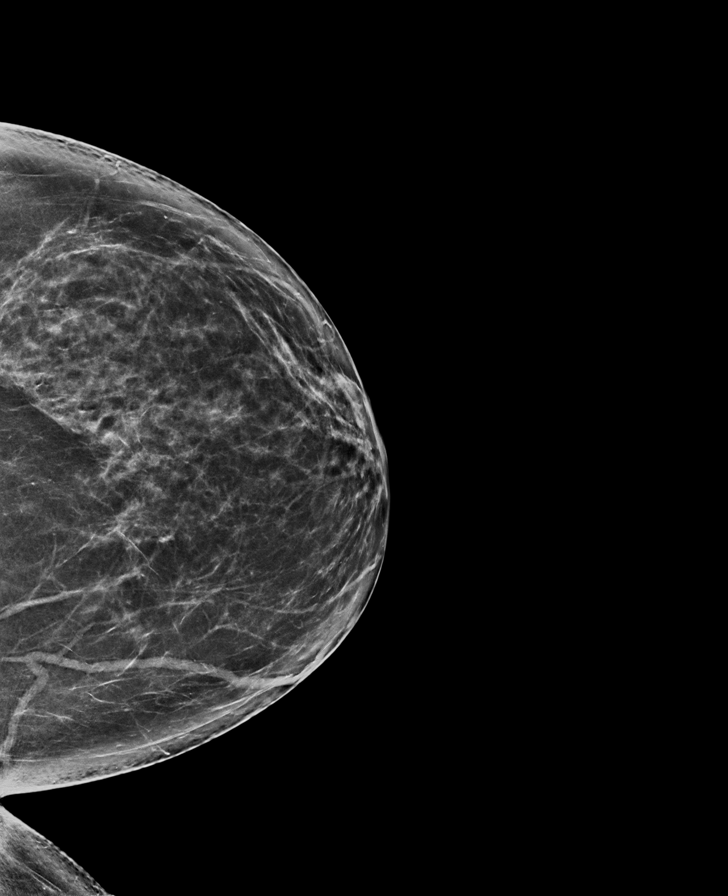

[R MLO synth-2D]
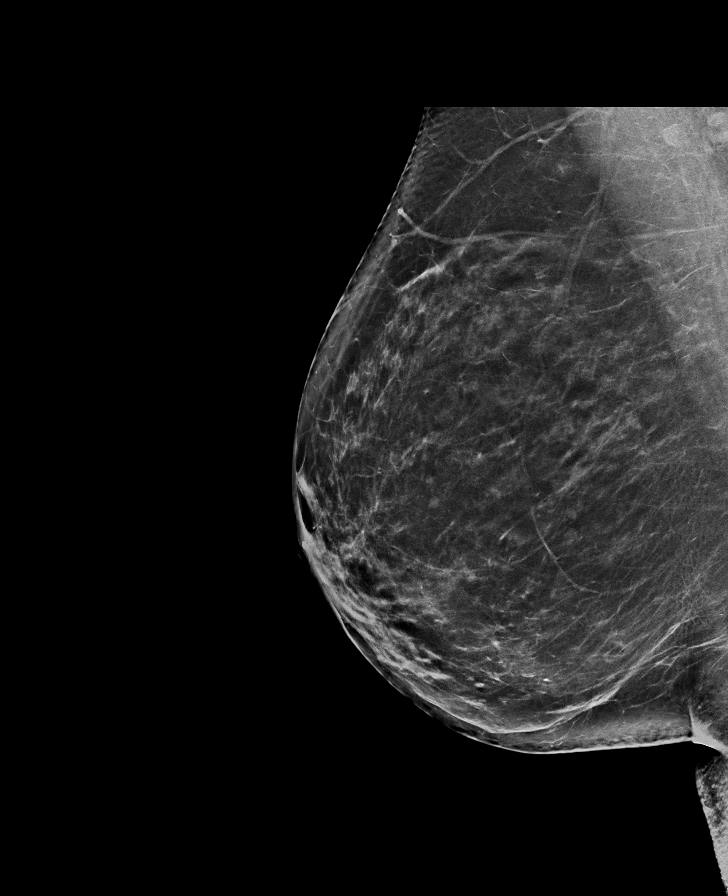

[R CC synth-2D]
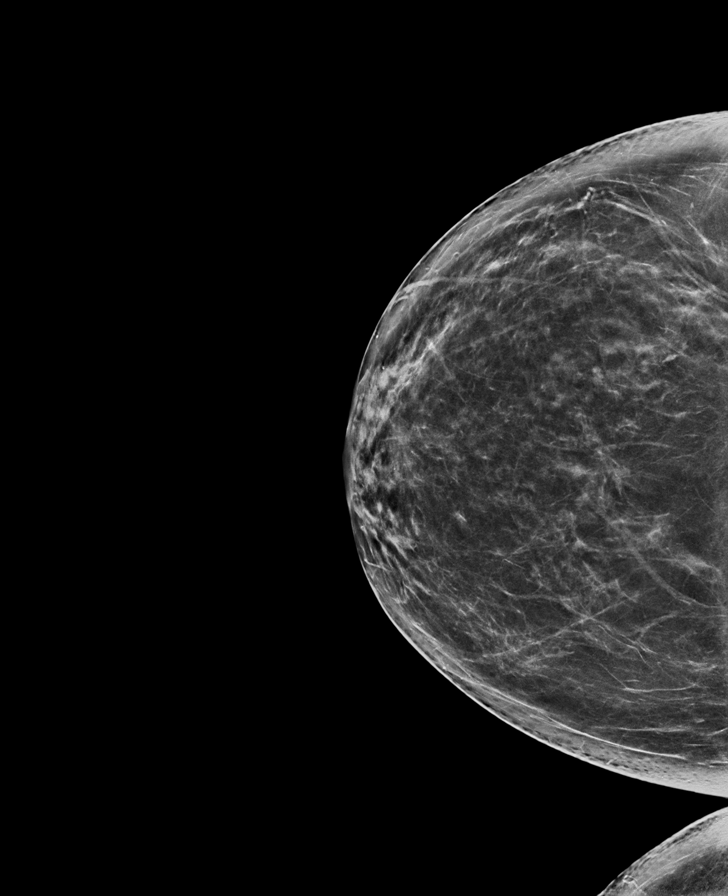

[L MLO synth-2D]
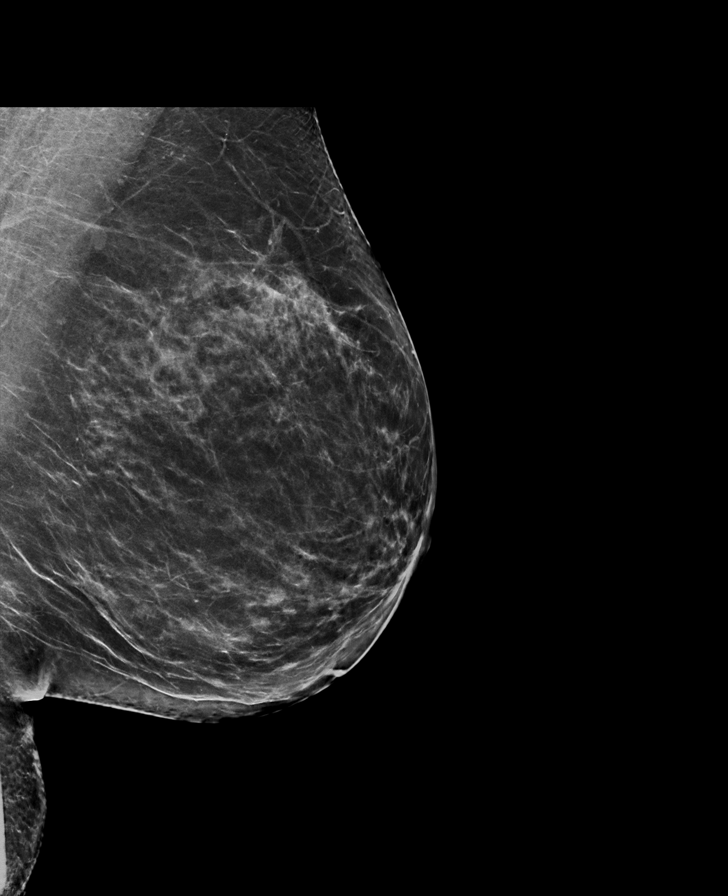

[R CC tomo · tomo slice 40/79.0]
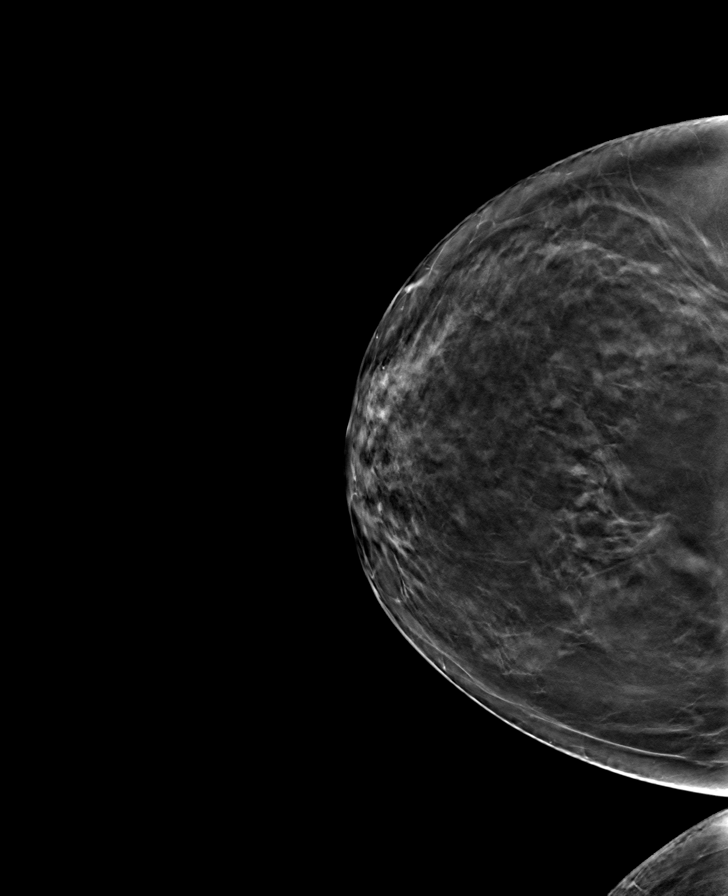

[R MLO tomo · tomo slice 41/81.0]
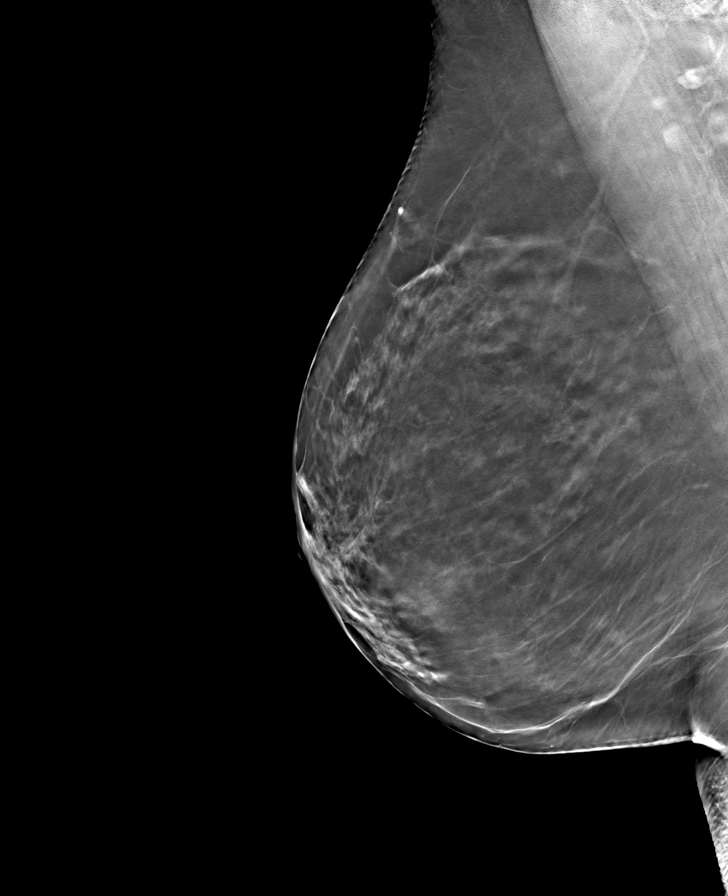

[L MLO tomo · tomo slice 41/82.0]
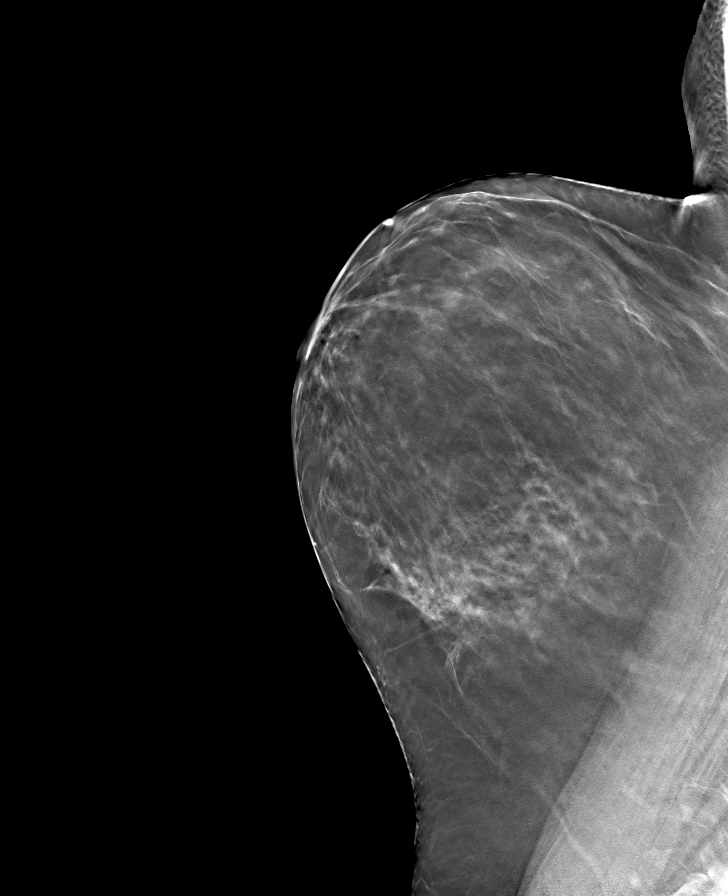

[L CC tomo · tomo slice 40/79.0]
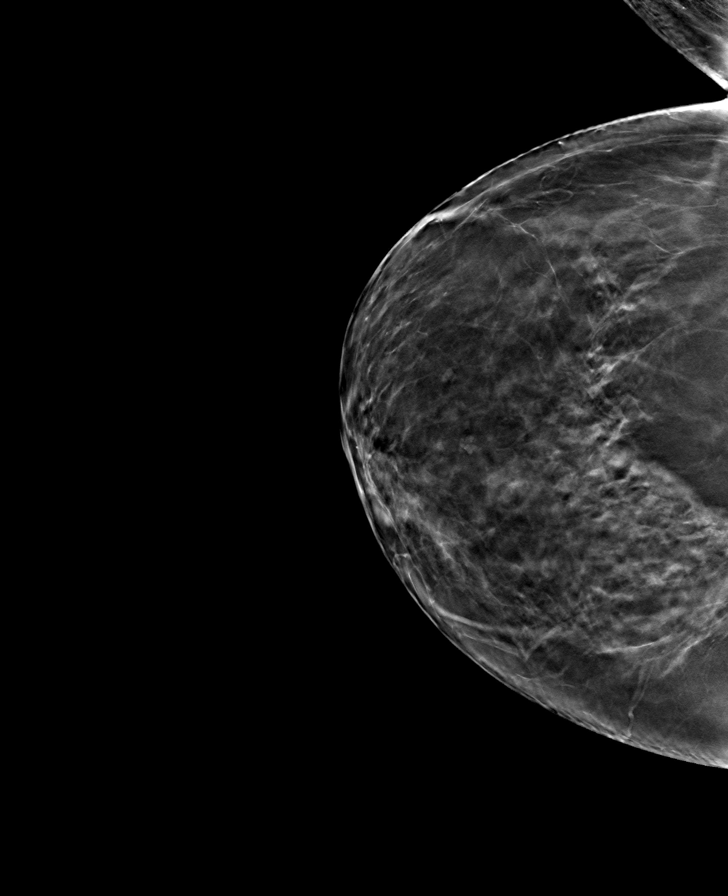

[8 of 24 positions shown; findings below may reference images not displayed]

ACR Breast Density Category b: There are scattered areas of
fibroglandular density.
FINDINGS: There are no findings suspicious for malignancy.
IMPRESSION: No mammographic evidence of malignancy. A result letter of this
screening mammogram will be mailed directly to the patient.

RECOMMENDATION:
Screening mammogram in one year. (Code:51-O-LD2)

BI-RADS CATEGORY  1: Negative.

## 2022-04-19 ENCOUNTER — Other Ambulatory Visit: Payer: Self-pay | Admitting: Obstetrics and Gynecology

## 2022-04-19 DIAGNOSIS — Z1231 Encounter for screening mammogram for malignant neoplasm of breast: Secondary | ICD-10-CM

## 2022-05-25 ENCOUNTER — Ambulatory Visit
Admission: RE | Admit: 2022-05-25 | Discharge: 2022-05-25 | Disposition: A | Discharge status: Home / Self Care | Payer: Managed Care, Other (non HMO) | Source: Ambulatory Visit | Attending: Obstetrics and Gynecology | Admitting: Obstetrics and Gynecology

## 2022-05-25 DIAGNOSIS — Z1231 Encounter for screening mammogram for malignant neoplasm of breast: Secondary | ICD-10-CM | POA: Diagnosis not present

## 2023-03-01 ENCOUNTER — Ambulatory Visit (INDEPENDENT_AMBULATORY_CARE_PROVIDER_SITE_OTHER): Payer: Managed Care, Other (non HMO) | Admitting: Obstetrics and Gynecology

## 2023-03-01 ENCOUNTER — Encounter: Payer: Self-pay | Admitting: Obstetrics and Gynecology

## 2023-03-01 VITALS — BP 116/74 | HR 80 | Ht 66.5 in | Wt 166.2 lb

## 2023-03-01 DIAGNOSIS — Z01419 Encounter for gynecological examination (general) (routine) without abnormal findings: Secondary | ICD-10-CM | POA: Diagnosis not present

## 2023-03-01 DIAGNOSIS — Z1231 Encounter for screening mammogram for malignant neoplasm of breast: Secondary | ICD-10-CM

## 2023-03-01 DIAGNOSIS — Z1211 Encounter for screening for malignant neoplasm of colon: Secondary | ICD-10-CM

## 2023-03-01 NOTE — Progress Notes (Signed)
Patients presents for annual exam today. Up to date on pap smear. Due for mammogram and colonoscopy, ordered. Patients annual labs are ordered. She states no other questions or concerns at this time.

## 2023-03-01 NOTE — Progress Notes (Signed)
HPI:      [name] is a 62 y.o. [id number] who LMP was Patient's last menstrual period was 02/01/2017.  Subjective:   She presents today for her annual examination.  She reports no significant problems.  She does state that she has recently begun having some episodes of contact dermatitis.  She has seen a dermatologist and she is currently working through that. She has noted some constipation and would like to discuss that. She is up-to-date on Pap smears.  Her mammogram is due in January.    Hx: The following portions of the patient's history were reviewed and updated as appropriate:             She  has a past medical history of Abnormal uterine bleeding (AUB), Anemia, H/O vitamin D deficiency, Menopause, and Uterine leiomyoma. She does not have any pertinent problems on file. She  has a past surgical history that includes Tonsillectomy (1960s) and Endometrial biopsy (08/13/2014). Her family history includes Diabetes in her father and mother; Heart disease in her mother; Hemochromatosis in her brother; Hypertension in her father and mother; Pancreatic cancer in her father. She  reports that she quit smoking about 30 years ago. Her smoking use included cigarettes. She started smoking about 40 years ago. She has never used smokeless tobacco. She reports current alcohol use. She reports that she does not use drugs. She has a current medication list which includes the following prescription(s): albuterol and hydrochlorothiazide. She has No Known Allergies.       Review of Systems:  Review of Systems  Constitutional: Denied constitutional symptoms, night sweats, recent illness, fatigue, fever, insomnia and weight loss.  Eyes: Denied eye symptoms, eye pain, photophobia, vision change and visual disturbance.  Ears/Nose/Throat/Neck: Denied ear, nose, throat or neck symptoms, hearing loss, nasal discharge, sinus congestion and sore throat.  Cardiovascular: Denied cardiovascular symptoms,  arrhythmia, chest pain/pressure, edema, exercise intolerance, orthopnea and palpitations.  Respiratory: Denied pulmonary symptoms, asthma, pleuritic pain, productive sputum, cough, dyspnea and wheezing.  Gastrointestinal: See HPI for additional information.  Genitourinary: Denied genitourinary symptoms including symptomatic vaginal discharge, pelvic relaxation issues, and urinary complaints.  Musculoskeletal: Denied musculoskeletal symptoms, stiffness, swelling, muscle weakness and myalgia.  Dermatologic: Denied dermatology symptoms, rash and scar.  Neurologic: Denied neurology symptoms, dizziness, headache, neck pain and syncope.  Psychiatric: Denied psychiatric symptoms, anxiety and depression.  Endocrine: Denied endocrine symptoms including hot flashes and night sweats.   Meds:   Current Outpatient Medications on File Prior to Visit  Medication Sig Dispense Refill   albuterol (VENTOLIN HFA) 108 (90 Base) MCG/ACT inhaler SMARTSIG:1-2 Puff(s) By Mouth Every 4-6 Hours PRN     hydrochlorothiazide (HYDRODIURIL) 12.5 MG tablet TAKE 1 TABLET(12.5 MG) BY MOUTH TWICE DAILY 60 tablet 1   No current facility-administered medications on file prior to visit.     Objective:     Vitals:   03/01/23 0823  BP: (!) 145/88  Pulse: 80    Filed Weights   03/01/23 0823  Weight: 166 lb 3.2 oz (75.4 kg)              Patient deferred exam today.  Assessment:    G2P2002 Patient Active Problem List   Diagnosis Date Noted   Vaginal atrophy 02/07/2018   History of anemia 02/06/2017   Menopause 02/06/2017   Uterine leiomyoma 06/29/2015   Postmenopausal bleeding 06/14/2014   Abdominal pain, chronic, right lower quadrant 06/14/2014   Encounter for routine gynecological examination 04/08/2013   Screening for  cervical cancer 04/08/2013   Routine general medical examination at a health care facility 12/17/2012   Sleep disturbance 12/17/2012   Constipation 12/17/2012     1. Well woman exam with  routine gynecological exam   2. Screening mammogram for breast cancer   3. Screen for colon cancer        Plan:            1.  Basic Screening Recommendations The basic screening recommendations for asymptomatic women were discussed with the patient during her visit.  The age-appropriate recommendations were discussed with her and the rational for the tests reviewed.  When I am informed by the patient that another primary care physician has previously obtained the age-appropriate tests and they are up-to-date, only outstanding tests are ordered and referrals given as necessary.  Abnormal results of tests will be discussed with her when all of her results are completed.  Routine preventative health maintenance measures emphasized: Exercise/Diet/Weight control, Tobacco Warnings, Alcohol/Substance use risks and Stress Management Mammogram ordered-follow-up colonoscopy discussed and ordered. 2.  Discussed use of fiber laxatives for constipation including Citrucel   Orders Orders Placed This Encounter  Procedures   MM DIGITAL SCREENING BILATERAL   Ambulatory referral to Gastroenterology    No orders of the defined types were placed in this encounter.         F/U  Return in about 1 year (around 02/29/2024) for Annual Physical.  Elonda Husky, M.D. 03/01/2023 8:53 AM

## 2023-03-08 ENCOUNTER — Encounter: Payer: Self-pay | Admitting: *Deleted

## 2023-06-12 ENCOUNTER — Other Ambulatory Visit: Payer: Self-pay | Admitting: Obstetrics and Gynecology

## 2023-06-12 ENCOUNTER — Ambulatory Visit
Admission: RE | Admit: 2023-06-12 | Discharge: 2023-06-12 | Disposition: A | Discharge status: Home / Self Care | Payer: Managed Care, Other (non HMO) | Source: Ambulatory Visit | Attending: Obstetrics and Gynecology | Admitting: Obstetrics and Gynecology

## 2023-06-12 DIAGNOSIS — Z01419 Encounter for gynecological examination (general) (routine) without abnormal findings: Secondary | ICD-10-CM

## 2023-06-12 DIAGNOSIS — Z1231 Encounter for screening mammogram for malignant neoplasm of breast: Secondary | ICD-10-CM

## 2023-06-12 DIAGNOSIS — Z1211 Encounter for screening for malignant neoplasm of colon: Secondary | ICD-10-CM

## 2023-08-08 ENCOUNTER — Ambulatory Visit (INDEPENDENT_AMBULATORY_CARE_PROVIDER_SITE_OTHER)

## 2023-08-08 ENCOUNTER — Ambulatory Visit (INDEPENDENT_AMBULATORY_CARE_PROVIDER_SITE_OTHER): Admitting: Podiatry

## 2023-08-08 ENCOUNTER — Encounter: Payer: Self-pay | Admitting: Podiatry

## 2023-08-08 DIAGNOSIS — M7752 Other enthesopathy of left foot: Secondary | ICD-10-CM

## 2023-08-08 DIAGNOSIS — M67472 Ganglion, left ankle and foot: Secondary | ICD-10-CM

## 2023-08-08 DIAGNOSIS — M722 Plantar fascial fibromatosis: Secondary | ICD-10-CM

## 2023-08-08 DIAGNOSIS — G5782 Other specified mononeuropathies of left lower limb: Secondary | ICD-10-CM | POA: Diagnosis not present

## 2023-08-08 MED ORDER — METHYLPREDNISOLONE 4 MG PO TBPK: ORAL_TABLET | ORAL | 0 refills | Status: AC

## 2023-08-08 MED ORDER — TRIAMCINOLONE ACETONIDE 40 MG/ML IJ SUSP
20.0000 mg | Freq: Once | INTRAMUSCULAR | Status: AC
  Administered 2023-08-08: 20 mg

## 2023-08-08 NOTE — Progress Notes (Signed)
  Subjective:  Patient ID: Caitlyn [name], female    DOB: 01-14-1961,  MRN: [id number] HPI Chief Complaint  Patient presents with   Foot Pain    Plantar forefoot left - burns under 4th toe, feels like the 4th toe is "hanging", also felt snap in arch few months ago while shopping, now has knot, cyst dorsal/lateral foot that won't go away-previous fracture in that area in high school   New Patient (Initial Visit)    Est pt 2021    63 y.o. female presents with the above complaint.   ROS: Denies fever chills nausea vomiting muscle aches pains calf pain back pain chest pain shortness of breath.  Past Medical History:  Diagnosis Date   Abnormal uterine bleeding (AUB)    Anemia    H/O vitamin D deficiency    Menopause    Uterine leiomyoma    Past Surgical History:  Procedure Laterality Date   ENDOMETRIAL BIOPSY  08/13/2014   weakly proliferative endometrium- no hyperplasia or carcinoma   TONSILLECTOMY  1960s    Current Outpatient Medications:    albuterol (VENTOLIN HFA) 108 (90 Base) MCG/ACT inhaler, SMARTSIG:1-2 Puff(s) By Mouth Every 4-6 Hours PRN, Disp: , Rfl:    hydrochlorothiazide (HYDRODIURIL) 12.5 MG tablet, TAKE 1 TABLET(12.5 MG) BY MOUTH TWICE DAILY, Disp: 60 tablet, Rfl: 1  No Known Allergies Review of Systems Objective:  There were no vitals filed for this visit.  General: Well developed, nourished, in no acute distress, alert and oriented x3   Dermatological: Skin is warm, dry and supple bilateral. Nails x 10 are well maintained; remaining integument appears unremarkable at this time. There are no open sores, no preulcerative lesions, no rash or signs of infection present.  She has a dermatofibroma medial longitudinal arch skin only does not develop to fascia.  Vascular: Dorsalis Pedis artery and Posterior Tibial artery pedal pulses are 2/4 bilateral with immedate capillary fill time. Pedal hair growth present. No varicosities and no lower extremity edema present  bilateral.   Neruologic: Grossly intact via light touch bilateral. Vibratory intact via tuning fork bilateral. Protective threshold with Semmes Wienstein monofilament intact to all pedal sites bilateral. Patellar and Achilles deep tendon reflexes 2+ bilateral. No Babinski or clonus noted bilateral.  Palpable Mulder's click third interdigital space of the left foot.  Musculoskeletal: No gross boney pedal deformities bilateral. No pain, crepitus, or limitation noted with foot and ankle range of motion bilateral. Muscular strength 5/5 in all groups tested bilateral.  Nonpulsatile multilobulated cyst overlying the fourth fifth tarsometatarsal articulation.  Appears to be ganglion is nonpulsatile and firm.  Gait: Unassisted, Nonantalgic.    Radiographs:  Radiographs taken today demonstrate osseously mature individual with no significant osseous abnormalities good bone mineralization.  Assessment & Plan:   Assessment: Neuroma third interdigital space left foot.  Ganglion cyst dorsal lateral left foot.  Dermatofibroma arch left foot.  Plan: Discussed etiology pathology conservative versus surgical therapies.  At this point we isolated the neuroma and injected cortisone directly into it.  Utilizing approximately 15 mg of Kenalog.  We also dispensed a prescription for methylprednisolone.  Follow-up with her on an as-needed basis.     [name], [place]

## 2023-09-05 ENCOUNTER — Ambulatory Visit: Admitting: Podiatry
